# Patient Record
Sex: Female | Born: 1960 | Race: White | Hispanic: No | Marital: Single | Smoking: Current every day smoker
Health system: Southern US, Community
[De-identification: ages and names within clinical notes are randomized; demographics above are authoritative.]

## PROBLEM LIST (undated history)

## (undated) DIAGNOSIS — F329 Major depressive disorder, single episode, unspecified: Secondary | ICD-10-CM

## (undated) DIAGNOSIS — Z8679 Personal history of other diseases of the circulatory system: Secondary | ICD-10-CM

## (undated) DIAGNOSIS — G8929 Other chronic pain: Secondary | ICD-10-CM

## (undated) DIAGNOSIS — F419 Anxiety disorder, unspecified: Secondary | ICD-10-CM

## (undated) DIAGNOSIS — I639 Cerebral infarction, unspecified: Secondary | ICD-10-CM

## (undated) DIAGNOSIS — F32A Depression, unspecified: Secondary | ICD-10-CM

## (undated) DIAGNOSIS — R569 Unspecified convulsions: Secondary | ICD-10-CM

## (undated) DIAGNOSIS — R109 Unspecified abdominal pain: Secondary | ICD-10-CM

## (undated) DIAGNOSIS — I499 Cardiac arrhythmia, unspecified: Secondary | ICD-10-CM

## (undated) HISTORY — PX: OTHER SURGICAL HISTORY: SHX169

---

## 1998-05-23 ENCOUNTER — Ambulatory Visit (HOSPITAL_COMMUNITY): Admission: RE | Admit: 1998-05-23 | Discharge: 1998-05-23 | Payer: Self-pay | Admitting: Internal Medicine

## 1999-11-28 ENCOUNTER — Emergency Department (HOSPITAL_COMMUNITY): Admission: EM | Admit: 1999-11-28 | Discharge: 1999-11-28 | Payer: Self-pay | Admitting: Emergency Medicine

## 1999-11-28 ENCOUNTER — Encounter: Payer: Self-pay | Admitting: Emergency Medicine

## 1999-11-30 ENCOUNTER — Emergency Department (HOSPITAL_COMMUNITY): Admission: EM | Admit: 1999-11-30 | Discharge: 1999-11-30 | Payer: Self-pay | Admitting: Emergency Medicine

## 1999-11-30 ENCOUNTER — Encounter: Payer: Self-pay | Admitting: Emergency Medicine

## 1999-12-03 ENCOUNTER — Ambulatory Visit (HOSPITAL_COMMUNITY): Admission: RE | Admit: 1999-12-03 | Discharge: 1999-12-03 | Payer: Self-pay | Admitting: Gastroenterology

## 2000-02-06 ENCOUNTER — Encounter: Payer: Self-pay | Admitting: Emergency Medicine

## 2000-02-06 ENCOUNTER — Encounter: Payer: Self-pay | Admitting: Gastroenterology

## 2000-02-07 ENCOUNTER — Inpatient Hospital Stay (HOSPITAL_COMMUNITY): Admission: EM | Admit: 2000-02-07 | Discharge: 2000-02-10 | Payer: Self-pay | Admitting: Emergency Medicine

## 2000-02-09 ENCOUNTER — Encounter: Payer: Self-pay | Admitting: Gastroenterology

## 2000-02-18 ENCOUNTER — Ambulatory Visit (HOSPITAL_COMMUNITY): Admission: RE | Admit: 2000-02-18 | Discharge: 2000-02-18 | Payer: Self-pay | Admitting: Gastroenterology

## 2000-03-08 ENCOUNTER — Other Ambulatory Visit: Admission: RE | Admit: 2000-03-08 | Discharge: 2000-03-08 | Payer: Self-pay | Admitting: Obstetrics and Gynecology

## 2000-04-21 ENCOUNTER — Ambulatory Visit (HOSPITAL_COMMUNITY): Admission: RE | Admit: 2000-04-21 | Discharge: 2000-04-21 | Payer: Self-pay | Admitting: Obstetrics and Gynecology

## 2001-04-18 ENCOUNTER — Other Ambulatory Visit: Admission: RE | Admit: 2001-04-18 | Discharge: 2001-04-18 | Payer: Self-pay | Admitting: Obstetrics and Gynecology

## 2015-02-12 ENCOUNTER — Encounter (HOSPITAL_COMMUNITY): Payer: Self-pay | Admitting: Emergency Medicine

## 2015-02-12 ENCOUNTER — Emergency Department (INDEPENDENT_AMBULATORY_CARE_PROVIDER_SITE_OTHER)
Admission: EM | Admit: 2015-02-12 | Discharge: 2015-02-12 | Disposition: A | Discharge status: Home / Self Care | Payer: PRIVATE HEALTH INSURANCE | Source: Home / Self Care | Attending: Family Medicine | Admitting: Family Medicine

## 2015-02-12 HISTORY — DX: Acute intermittent (hepatic) porphyria: E80.21

## 2015-02-12 HISTORY — DX: Depression, unspecified: F32.A

## 2015-02-12 HISTORY — DX: Anxiety disorder, unspecified: F41.9

## 2015-02-12 HISTORY — DX: Other chronic pain: G89.29

## 2015-02-12 HISTORY — DX: Unspecified abdominal pain: R10.9

## 2015-02-12 HISTORY — DX: Major depressive disorder, single episode, unspecified: F32.9

## 2015-02-12 NOTE — Discharge Instructions (Signed)
Porphyria  Porphyria is a group of disorders. It is caused by problems with how your body produces heme. Heme is a substance found in all body tissues. The largest amounts of heme are in the red blood cells, bone marrow, and liver. Your body uses special proteins (called enzymes) to convert chemicals called porphyrins into heme. If any one of the enzymes is abnormal, the process cannot continue. These porphyrins may build up and cause symptoms. The skin and nervous system are most often affected.   Porphyria is usually broken down into 2 categories:  · Acute porphyrias - include forms of the disease with mostly nervous system symptoms (neuron porphyrias). In some cases, there are both nervous system and skin symptoms (neurocutaneous porphyrias). Your body can be damaged in several ways from the acute porphyrias. High blood pressure, kidney failure, and liver cancer can result over time.  · Cutaneous porphyrias - include forms of the disease that cause skin symptoms without affecting your nervous system.  CAUSES   The porphyrias are passed from your parents to you (inherited).   · Some forms of porphyria result from inheriting an abnormal gene from one parent (autosomal dominant).  · Other forms are from inheriting an abnormal gene from each parent (autosomal recessive).  · The risk that individuals in an affected family will have the disease or transmit it to their children is different depending on the type. Porphyria can be triggered by:  ¨ Emotional and physical stress.  ¨ Chemicals.  ¨ Drinking alcohol.  ¨ Menstrual hormones.  ¨ Birth control pills.  ¨ Fasting.  ¨ Infections.  ¨ Exposure to the sun.  ¨ Sedatives.  ¨ Smoking.  ¨ Emotional and physical stress.  ¨ Drugs (barbiturates, tranquilizers).  SYMPTOMS   Specific signs and symptoms depend on the type of porphyria.  People with cutaneous porphyrias can develop:  · Blisters, itching, and swelling of their skin when it is exposed to sunlight.  People with  Acute porphyrias can develop:  · Pain in the chest, abdomen, limbs, or back.  · Other symptoms include muscle numbness, tingling, paralysis, cramping, vomiting, constipation, and personality changes.  · Different psychiatric symptoms can include paranoia, hallucinations, confusion, anxiety, depression, and violent behavior.  These acute porphyrias symptoms may appear intermittently. Other possible symptoms include:  · Unstable vital signs.  · Excessive sweating.  · Painful urination and other bladder problems. In some cases, urine may turn red or dark when exposed to light.  · Fever.  · Restlessness.  · Tremors.  · Vision problems (including blindness).  · Seizures.  In cases of neurocutaneous porphyrias, both skin and nervous system symptoms can occur at the same time. Attacks of porphyria can develop over hours or days. The attacks can last for days or weeks.   DIAGNOSIS   Diagnosis may be difficult because the range of symptoms is common to many disorders. Porphyria is diagnosed through blood, urine, and stool tests. Interpretation of the tests may be complex. If tests are positive for porphyria, specific results can narrow down the exact type that exists.  TREATMENT   Treatment must be specific to the person. Initial treatment can include:  · Stopping medicines that may have started the attack.  · Medicines to help relieve pain, nausea, and vomiting.  · A high carbohydrate diet and good hydration is essential.  Hospitalization is required if there is a severe attack. Medicines to help pain and nausea may be given through an intravenous line. Other important medicines   that may be needed include:  · Hematin (a form of heme).  · Anti-seizure medicines, if needed.  · Medicines to control psychiatric symptoms.  HOME CARE INSTRUCTIONS   · Eat a high carbohydrate diet.  · Eat a low salt and low fat diet if there are problems of high blood pressure.  · Avoid alcoholic beverages.  · Avoid smoking.  · For women, there may  be a benefit in using birth control pills to control your periods.  SEEK MEDICAL CARE IF:   · You develop a recurrence of any of the symptoms noted above.  · You have been advised to start any new prescription medicines. There is a long list of medicines that can possibly trigger an attack or porphyria. Talk to your caregiver to make sure a new medicine is not included in that list.  SEEK IMMEDIATE MEDICAL CARE IF:   You develop a recurrence of any symptoms noted above not helped by the "initial treatment" recommendations described above.  FOR MORE INFORMATION   [employer]: www.porphyriafoundation.com  [employer]: nddic@info.niddk.nih.gov  Document Released: 08/30/2007 Document Revised: 03/19/2014 Document Reviewed: 08/30/2007  [employer]® Patient Information ©2015 [employer]. This information is not intended to replace advice given to you by your health care provider. Make sure you discuss any questions you have with your health care provider.

## 2015-02-12 NOTE — ED Provider Notes (Signed)
CSN: [id number]     Arrival date & time 02/12/15  1344 History   First MD Initiated Contact with Patient 02/12/15 1506     Chief Complaint  Patient presents with  . Abdominal Pain   (Consider location/radiation/quality/duration/timing/severity/associated sxs/prior Treatment) HPI Comments: 54 year old female states she has just moved to the area from [place] and has been here for 4 days. 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 She has been hospitalized, history tree of anemia and other psychological issues. 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 She comes in the urgent care for refills of her narcotic analgesics and benzodiazepines. She states these medicines while taking on a regular basis prevent her from having poor for flareups. She has chronic epigastric pain. At this time she is in no acute distress. She is somewhat anxious and depressed. She holds her epigastrium with her hand but states this is her typical baseline pain. She swelling no signs of infection. No vomiting. She states that if she does not get her narcotics filled today she will end up going into the hospital tomorrow.   Past Medical History  Diagnosis Date  . Acute porphyria   . Chronic abdominal pain   . Anxiety   . Depression    Past Surgical History  Procedure Laterality Date  . Abdominal patch     No family history on file. History  Substance Use Topics  . Smoking status: Current Every Day Smoker  . Smokeless tobacco: Not on file  . Alcohol Use: No   OB History    No data available     Review of Systems  Constitutional: Positive for activity change and appetite change. Negative for fever.  HENT: Negative.   Respiratory: Negative.   Cardiovascular: Negative for chest pain.  Gastrointestinal: Positive for abdominal pain. Negative for vomiting and diarrhea.  Genitourinary: Negative.    Neurological: Negative.   Psychiatric/Behavioral: Positive for sleep disturbance and dysphoric mood. Negative for suicidal ideas. The patient is nervous/anxious.     Allergies  Heparin; Penicillins; and Toradol  Home Medications   Prior to Admission medications   Medication Sig Start Date End Date Taking? Authorizing Provider  ALPRAZolam Prudy Feeler) 0.5 MG tablet Take 0.5 mg by mouth 2 (two) times daily as needed for anxiety.   Yes Historical Provider, MD  clonazePAM (KLONOPIN) 2 MG tablet Take 2 mg by mouth 2 (two) times daily.   Yes Historical Provider, MD  fentaNYL (DURAGESIC - DOSED MCG/HR) 75 MCG/HR Place 75 mcg onto the skin every 3 (three) days.   Yes Historical Provider, MD  OxyCODONE (OXYCONTIN) 15 mg T12A 12 hr tablet Take 15 mg by mouth every 12 (twelve) hours.   Yes Historical Provider, MD   BP 134/94 mmHg  Pulse 89  Temp(Src) 98.6 F (37 C) (Oral)  Resp 16  SpO2 96% Physical Exam  Constitutional: She is oriented to person, place, and time. She appears well-developed and well-nourished. No distress.  Eyes: Conjunctivae and EOM are normal.  Neck: Normal range of motion. Neck supple.  Cardiovascular: Normal rate, regular rhythm, normal heart sounds and intact distal pulses.   Pulmonary/Chest: Effort normal. No respiratory distress. She has no rales.  Mildly diminished breath sounds bilaterally. Rare expiratory wheeze.  Abdominal: Soft. Bowel sounds are normal. She exhibits no distension. There is no rebound and no guarding.  Mild tenderness over the epigastrium. Abdomen is  scaphoid   Neurological: She is alert and oriented to person, place, and time.  Skin: Skin is warm and dry.  Psychiatric: She has a normal mood and affect.  Nursing note and vitals reviewed.   ED Course  Procedures (including critical care time) Labs Review Labs Reviewed - No data to display  Imaging Review No results found.   MDM   1. Porphyria    Pt requests refill of Fentanyl and  Oxycodone for prevention of attacks. Unable to refill here Gave options for follow up and pt st has a PCP here that has seen her before. Pt left when told we could not fill her narcotics.    Hayden Rasmussenavid Lealon Vanputten, NP 02/12/15 [phone]

## 2015-02-12 NOTE — ED Notes (Signed)
Chronic abdominal pain.  Patient gives a long history of abdominal pain, diagnoses, hospital admissions.  Patient is new to the area from [place], no pcp, running out of chronic pain medications.

## 2015-02-16 ENCOUNTER — Encounter (HOSPITAL_COMMUNITY): Payer: Self-pay | Admitting: *Deleted

## 2015-02-16 ENCOUNTER — Emergency Department (HOSPITAL_COMMUNITY)
Admission: EM | Admit: 2015-02-16 | Discharge: 2015-02-16 | Disposition: A | Discharge status: Home / Self Care | Payer: Medicaid - Out of State | Attending: Emergency Medicine | Admitting: Emergency Medicine

## 2015-02-16 DIAGNOSIS — R1013 Epigastric pain: Secondary | ICD-10-CM | POA: Insufficient documentation

## 2015-02-16 DIAGNOSIS — F329 Major depressive disorder, single episode, unspecified: Secondary | ICD-10-CM | POA: Insufficient documentation

## 2015-02-16 DIAGNOSIS — Z8673 Personal history of transient ischemic attack (TIA), and cerebral infarction without residual deficits: Secondary | ICD-10-CM | POA: Diagnosis not present

## 2015-02-16 DIAGNOSIS — Z8669 Personal history of other diseases of the nervous system and sense organs: Secondary | ICD-10-CM | POA: Insufficient documentation

## 2015-02-16 DIAGNOSIS — Z880 Allergy status to penicillin: Secondary | ICD-10-CM | POA: Insufficient documentation

## 2015-02-16 DIAGNOSIS — R1110 Vomiting, unspecified: Secondary | ICD-10-CM | POA: Insufficient documentation

## 2015-02-16 DIAGNOSIS — Z79899 Other long term (current) drug therapy: Secondary | ICD-10-CM | POA: Diagnosis not present

## 2015-02-16 DIAGNOSIS — G8929 Other chronic pain: Secondary | ICD-10-CM | POA: Insufficient documentation

## 2015-02-16 DIAGNOSIS — L409 Psoriasis, unspecified: Secondary | ICD-10-CM | POA: Insufficient documentation

## 2015-02-16 DIAGNOSIS — F419 Anxiety disorder, unspecified: Secondary | ICD-10-CM | POA: Diagnosis not present

## 2015-02-16 DIAGNOSIS — Z720 Tobacco use: Secondary | ICD-10-CM | POA: Insufficient documentation

## 2015-02-16 HISTORY — DX: Unspecified convulsions: R56.9

## 2015-02-16 HISTORY — DX: Cerebral infarction, unspecified: I63.9

## 2015-02-16 LAB — COMPREHENSIVE METABOLIC PANEL
ALK PHOS: 55 U/L (ref 39–117)
ALT: 8 U/L (ref 0–35)
AST: 17 U/L (ref 0–37)
Albumin: 4 g/dL (ref 3.5–5.2)
Anion gap: 11 (ref 5–15)
BUN: 9 mg/dL (ref 6–23)
CALCIUM: 9.5 mg/dL (ref 8.4–10.5)
CO2: 21 mmol/L (ref 19–32)
Chloride: 108 mmol/L (ref 96–112)
Creatinine, Ser: 0.83 mg/dL (ref 0.50–1.10)
GFR calc Af Amer: >90 mL/min (ref >90)
GFR, EST NON AFRICAN AMERICAN: 80 mL/min — AB (ref >90)
Glucose, Bld: 99 mg/dL (ref 70–99)
Potassium: 4 mmol/L (ref 3.5–5.1)
SODIUM: 140 mmol/L (ref 135–145)
Total Bilirubin: 0.7 mg/dL (ref 0.3–1.2)
Total Protein: 7.2 g/dL (ref 6.0–8.3)

## 2015-02-16 LAB — URINALYSIS, ROUTINE W REFLEX MICROSCOPIC
Glucose, UA: NEGATIVE mg/dL
HGB URINE DIPSTICK: NEGATIVE
Ketones, ur: 15 mg/dL — AB
Leukocytes, UA: NEGATIVE
Nitrite: NEGATIVE
PH: 5 (ref 5.0–8.0)
Protein, ur: NEGATIVE mg/dL
Specific Gravity, Urine: 1.027 (ref 1.005–1.030)
Urobilinogen, UA: 1 mg/dL (ref 0.0–1.0)

## 2015-02-16 LAB — URINE MICROSCOPIC-ADD ON

## 2015-02-16 LAB — CBC WITH DIFFERENTIAL/PLATELET
Basophils Absolute: 0.1 10*3/uL (ref 0.0–0.1)
Basophils Relative: 1 % (ref 0–1)
Eosinophils Absolute: 0.1 10*3/uL (ref 0.0–0.7)
Eosinophils Relative: 2 % (ref 0–5)
HCT: 44.7 % (ref 36.0–46.0)
HEMOGLOBIN: 15.1 g/dL — AB (ref 12.0–15.0)
LYMPHS ABS: 1.9 10*3/uL (ref 0.7–4.0)
LYMPHS PCT: 26 % (ref 12–46)
MCH: 30.2 pg (ref 26.0–34.0)
MCHC: 33.8 g/dL (ref 30.0–36.0)
MCV: 89.4 fL (ref 78.0–100.0)
Monocytes Absolute: 0.3 10*3/uL (ref 0.1–1.0)
Monocytes Relative: 4 % (ref 3–12)
NEUTROS ABS: 4.9 10*3/uL (ref 1.7–7.7)
NEUTROS PCT: 67 % (ref 43–77)
Platelets: 225 10*3/uL (ref 150–400)
RBC: 5 MIL/uL (ref 3.87–5.11)
RDW: 13.3 % (ref 11.5–15.5)
WBC: 7.3 10*3/uL (ref 4.0–10.5)

## 2015-02-16 LAB — LIPASE, BLOOD: Lipase: 41 U/L (ref 11–59)

## 2015-02-16 MED ORDER — DICYCLOMINE HCL 10 MG/ML IM SOLN
20.0000 mg | Freq: Once | INTRAMUSCULAR | Status: AC
  Administered 2015-02-16: 20 mg via INTRAMUSCULAR
  Filled 2015-02-16: qty 2

## 2015-02-16 MED ORDER — ONDANSETRON HCL 4 MG/2ML IJ SOLN
4.0000 mg | Freq: Once | INTRAMUSCULAR | Status: AC
  Administered 2015-02-16: 4 mg via INTRAVENOUS
  Filled 2015-02-16: qty 2

## 2015-02-16 MED ORDER — ANTICOAGULANT SODIUM CITRATE 4% (200MG/5ML) IV SOLN
5.0000 mL | Freq: Once | Status: DC
  Filled 2015-02-16: qty 250

## 2015-02-16 NOTE — ED Notes (Addendum)
EDPA and this RN at bedside to speak with patient regarding her pain and plan of care.  Paperwork returned to patient's bedside.  Patient continues to be verbally aggressive towards staff and yelling that she is in pain. Pt also stating "I will be suicidal if you don't give me pain medicine."  EDPA at bedside for this statement and is aware.  Attempted to dim lights, therapeutically communicate with patient, and explain process.

## 2015-02-16 NOTE — ED Notes (Signed)
Pt unwilling to take discharge papers or e-sign for discharge.  PT continues to use expletives towards this RN and other staff members. Offered to anticoagulate Port again and explained risks if not performed.  Pt continues to refuse.  PT is alert and oriented, ambulated with steady gait from wheelchair in waiting area.

## 2015-02-16 NOTE — ED Provider Notes (Signed)
CSN: [id number]     Arrival date & time 02/16/15  0825 History   First MD Initiated Contact with Patient 02/16/15 [id number]     Chief Complaint  Patient presents with  . Abdominal Pain     (Consider location/radiation/quality/duration/timing/severity/associated sxs/prior Treatment) Patient is a 54 y.o. female presenting with abdominal pain. The history is provided by the patient. No language interpreter was used.  Abdominal Pain Associated symptoms: vomiting   Associated symptoms: no diarrhea and no vaginal bleeding   Ms. Jewel BaizeDarcy is a 54 y.o. White female who has a history of porphyria, chronic abdominal pain, and anxiety.  She presents for gradual onset constant epigastric pain that began at 5am this morning and she states it is similar to her previous "flare ups with porphryia."  She is nauseated and states she has some dysuria and hematuria for the past 3 days. She states her husband recently passed away and she takes xanax and klonopin for anxiety. She denies any fever, chills, chest pain, shortness of breath, vaginal discharge or pelvic pain.   Past Medical History  Diagnosis Date  . Acute porphyria   . Chronic abdominal pain   . Anxiety   . Depression   . Seizures   . Stroke    Past Surgical History  Procedure Laterality Date  . Abdominal patch     History reviewed. No pertinent family history. History  Substance Use Topics  . Smoking status: Current Every Day Smoker  . Smokeless tobacco: Not on file  . Alcohol Use: No   OB History    No data available     Review of Systems  Gastrointestinal: Positive for vomiting and abdominal pain. Negative for diarrhea and blood in stool.  Genitourinary: Negative for vaginal bleeding.  Neurological: Negative for dizziness.  All other systems reviewed and are negative.     Allergies  Heparin; Penicillins; and Toradol  Home Medications   Prior to Admission medications   Medication Sig Start Date End Date Taking? Authorizing  Provider  ALPRAZolam Prudy Feeler(XANAX) 0.5 MG tablet Take 0.5 mg by mouth 2 (two) times daily as needed for anxiety.    Historical Provider, MD  clonazePAM (KLONOPIN) 2 MG tablet Take 2 mg by mouth 2 (two) times daily.    Historical Provider, MD  fentaNYL (DURAGESIC - DOSED MCG/HR) 75 MCG/HR Place 75 mcg onto the skin every 3 (three) days.    Historical Provider, MD  OxyCODONE (OXYCONTIN) 15 mg T12A 12 hr tablet Take 15 mg by mouth every 12 (twelve) hours.    Historical Provider, MD   BP 111/95 mmHg  Pulse 70  Temp(Src) 97.5 F (36.4 C) (Oral)  Resp 16  SpO2 99% Physical Exam  Constitutional: She is oriented to person, place, and time. She appears well-developed and well-nourished.  HENT:  Head: Normocephalic and atraumatic.  Eyes: Conjunctivae and EOM are normal.  Neck: Normal range of motion. Neck supple.  Cardiovascular: Normal rate, regular rhythm and normal heart sounds.   Pulmonary/Chest: Effort normal and breath sounds normal.  Right sided chest port.   Abdominal: Soft. Normal appearance. She exhibits no distension and no mass. There is tenderness in the epigastric area. There is no guarding. No hernia.    Musculoskeletal: Normal range of motion.  Neurological: She is alert and oriented to person, place, and time.  Skin: Skin is warm and dry.  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   Nursing note and vitals reviewed.   ED Course  Procedures (including critical care time) Labs Review Labs  Reviewed  CBC WITH DIFFERENTIAL/PLATELET - Abnormal; Notable for the following:    Hemoglobin 15.1 (*)    All other components within normal limits  COMPREHENSIVE METABOLIC PANEL - Abnormal; Notable for the following:    GFR calc non Af Amer 80 (*)    All other components within normal limits  URINALYSIS, ROUTINE W REFLEX MICROSCOPIC - Abnormal; Notable for the following:    Color, Urine AMBER (*)    APPearance TURBID (*)    Bilirubin Urine SMALL (*)    Ketones, ur 15 (*)    All other components within normal  limits  LIPASE, BLOOD  URINE MICROSCOPIC-ADD ON    Imaging Review No results found.   EKG Interpretation None      MDM   Final diagnoses:  Epigastric pain   Patient has a history of porphyria and chronic abdominal pain during "flare ups."  She states her abdominal pain is similar to pain she has had in the past and that pain medication usually makes everything better including the nausea.  She refused zofran initially.  She has several documents at bedside with hand written medical problems listed. She has a port on the right side of her chest for  Phenergan due to porphyria.   She was seen at urgent care for the same 4 days ago.  She states that if she does not get morphine or dilaudid she will go out in the hall and scream.  She is crying and screaming in the room. She has not vomited while in the ED but states she will if she does not get any pain meds.  She also stated that she just moved here and doesn't have a pcp but now says she has an appointment on Monday with her pcp and that she just needs meds to get her through until Monday.  She also says, "I will say I am suicidal if you don't give me pain medication." I discussed this patient with Dr. Loretha Stapler who has seen the patient and agrees that she does not need narcotic pain medication.  Her vitals are normal.  No UTI. Labs are unremarkable. I thoroughly discussed the need for follow up with pain management for chronic abdominal pain.     Catha Gosselin, PA-C 02/16/15 1555  Blake Divine, MD 02/17/15 0730

## 2015-02-16 NOTE — ED Notes (Signed)
Pt has port, not collecting labs at triage.

## 2015-02-16 NOTE — ED Notes (Signed)
Patient screaming out in pain requesting narcotic pain medication specifically. EDPA Dahlia ClientHannah and Dr. Loretha StaplerWofford aware.  Patient states she has "medical records" with her - these have been given to EDPA to review.

## 2015-02-16 NOTE — Discharge Instructions (Signed)

## 2015-02-16 NOTE — ED Notes (Addendum)
Patient self-extracated Port-O-Cath access.  Explained risks/dangers of doing this to patient.  Pt using expletives and is not willing to have port reaccessed to flush with anticoagulant. Spoke with IV team to let them know of this as well.

## 2015-02-16 NOTE — ED Notes (Signed)
Dr. Loretha StaplerWofford at bedside to speak with patient.

## 2015-02-16 NOTE — ED Notes (Signed)
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

## 2015-02-17 ENCOUNTER — Emergency Department (HOSPITAL_COMMUNITY): Payer: Medicaid - Out of State

## 2015-02-17 ENCOUNTER — Inpatient Hospital Stay (HOSPITAL_COMMUNITY)
Admission: EM | Admit: 2015-02-17 | Discharge: 2015-02-19 | DRG: 871 | Disposition: A | Discharge status: Home / Self Care | Payer: Medicaid - Out of State | Attending: Internal Medicine | Admitting: Internal Medicine

## 2015-02-17 ENCOUNTER — Encounter (HOSPITAL_COMMUNITY): Payer: Self-pay | Admitting: Emergency Medicine

## 2015-02-17 DIAGNOSIS — F329 Major depressive disorder, single episode, unspecified: Secondary | ICD-10-CM | POA: Diagnosis present

## 2015-02-17 DIAGNOSIS — Z8674 Personal history of sudden cardiac arrest: Secondary | ICD-10-CM

## 2015-02-17 DIAGNOSIS — R1084 Generalized abdominal pain: Secondary | ICD-10-CM | POA: Insufficient documentation

## 2015-02-17 DIAGNOSIS — R109 Unspecified abdominal pain: Secondary | ICD-10-CM

## 2015-02-17 DIAGNOSIS — Z79891 Long term (current) use of opiate analgesic: Secondary | ICD-10-CM

## 2015-02-17 DIAGNOSIS — F419 Anxiety disorder, unspecified: Secondary | ICD-10-CM | POA: Diagnosis present

## 2015-02-17 DIAGNOSIS — D72829 Elevated white blood cell count, unspecified: Secondary | ICD-10-CM | POA: Diagnosis present

## 2015-02-17 DIAGNOSIS — F32A Depression, unspecified: Secondary | ICD-10-CM | POA: Diagnosis present

## 2015-02-17 DIAGNOSIS — R569 Unspecified convulsions: Secondary | ICD-10-CM | POA: Diagnosis present

## 2015-02-17 DIAGNOSIS — F418 Other specified anxiety disorders: Secondary | ICD-10-CM | POA: Diagnosis present

## 2015-02-17 DIAGNOSIS — Z8673 Personal history of transient ischemic attack (TIA), and cerebral infarction without residual deficits: Secondary | ICD-10-CM

## 2015-02-17 DIAGNOSIS — IMO0001 Reserved for inherently not codable concepts without codable children: Secondary | ICD-10-CM | POA: Insufficient documentation

## 2015-02-17 DIAGNOSIS — K921 Melena: Secondary | ICD-10-CM | POA: Diagnosis present

## 2015-02-17 DIAGNOSIS — I639 Cerebral infarction, unspecified: Secondary | ICD-10-CM | POA: Diagnosis present

## 2015-02-17 DIAGNOSIS — R112 Nausea with vomiting, unspecified: Secondary | ICD-10-CM | POA: Diagnosis present

## 2015-02-17 DIAGNOSIS — F17210 Nicotine dependence, cigarettes, uncomplicated: Secondary | ICD-10-CM | POA: Diagnosis present

## 2015-02-17 DIAGNOSIS — G8929 Other chronic pain: Secondary | ICD-10-CM | POA: Diagnosis present

## 2015-02-17 DIAGNOSIS — E43 Unspecified severe protein-calorie malnutrition: Secondary | ICD-10-CM | POA: Diagnosis present

## 2015-02-17 DIAGNOSIS — Z79899 Other long term (current) drug therapy: Secondary | ICD-10-CM

## 2015-02-17 DIAGNOSIS — Z888 Allergy status to other drugs, medicaments and biological substances status: Secondary | ICD-10-CM

## 2015-02-17 DIAGNOSIS — I4891 Unspecified atrial fibrillation: Secondary | ICD-10-CM | POA: Diagnosis present

## 2015-02-17 DIAGNOSIS — Z880 Allergy status to penicillin: Secondary | ICD-10-CM

## 2015-02-17 DIAGNOSIS — Z681 Body mass index (BMI) 19 or less, adult: Secondary | ICD-10-CM

## 2015-02-17 DIAGNOSIS — A419 Sepsis, unspecified organism: Secondary | ICD-10-CM

## 2015-02-17 DIAGNOSIS — I4581 Long QT syndrome: Secondary | ICD-10-CM | POA: Diagnosis present

## 2015-02-17 DIAGNOSIS — L409 Psoriasis, unspecified: Secondary | ICD-10-CM | POA: Diagnosis present

## 2015-02-17 DIAGNOSIS — I959 Hypotension, unspecified: Secondary | ICD-10-CM | POA: Diagnosis present

## 2015-02-17 DIAGNOSIS — I4892 Unspecified atrial flutter: Secondary | ICD-10-CM | POA: Diagnosis present

## 2015-02-17 DIAGNOSIS — R9431 Abnormal electrocardiogram [ECG] [EKG]: Secondary | ICD-10-CM

## 2015-02-17 DIAGNOSIS — R1013 Epigastric pain: Secondary | ICD-10-CM | POA: Diagnosis present

## 2015-02-17 HISTORY — DX: Cardiac arrhythmia, unspecified: I49.9

## 2015-02-17 LAB — I-STAT BETA HCG BLOOD, ED (MC, WL, AP ONLY): I-stat hCG, quantitative: <5 m[IU]/mL (ref <5)

## 2015-02-17 LAB — URINE MICROSCOPIC-ADD ON

## 2015-02-17 LAB — CBC WITH DIFFERENTIAL/PLATELET
BASOS PCT: 0 % (ref 0–1)
Basophils Absolute: 0.1 10*3/uL (ref 0.0–0.1)
EOS ABS: 0 10*3/uL (ref 0.0–0.7)
EOS PCT: 0 % (ref 0–5)
HEMATOCRIT: 48.1 % — AB (ref 36.0–46.0)
Hemoglobin: 16.2 g/dL — ABNORMAL HIGH (ref 12.0–15.0)
Lymphocytes Relative: 11 % — ABNORMAL LOW (ref 12–46)
Lymphs Abs: 1.7 10*3/uL (ref 0.7–4.0)
MCH: 30.6 pg (ref 26.0–34.0)
MCHC: 33.7 g/dL (ref 30.0–36.0)
MCV: 90.9 fL (ref 78.0–100.0)
MONO ABS: 0.5 10*3/uL (ref 0.1–1.0)
Monocytes Relative: 3 % (ref 3–12)
NEUTROS ABS: 12.9 10*3/uL — AB (ref 1.7–7.7)
NEUTROS PCT: 86 % — AB (ref 43–77)
PLATELETS: 281 10*3/uL (ref 150–400)
RBC: 5.29 MIL/uL — ABNORMAL HIGH (ref 3.87–5.11)
RDW: 13.5 % (ref 11.5–15.5)
WBC: 15.1 10*3/uL — AB (ref 4.0–10.5)

## 2015-02-17 LAB — URINALYSIS, ROUTINE W REFLEX MICROSCOPIC
Glucose, UA: NEGATIVE mg/dL
KETONES UR: 15 mg/dL — AB
LEUKOCYTES UA: NEGATIVE
Nitrite: NEGATIVE
Protein, ur: 30 mg/dL — AB
Specific Gravity, Urine: 1.03 (ref 1.005–1.030)
Urobilinogen, UA: 1 mg/dL (ref 0.0–1.0)
pH: 5.5 (ref 5.0–8.0)

## 2015-02-17 LAB — COMPREHENSIVE METABOLIC PANEL
ALK PHOS: 62 U/L (ref 39–117)
ALT: 13 U/L (ref 0–35)
ANION GAP: 13 (ref 5–15)
AST: 20 U/L (ref 0–37)
Albumin: 4.5 g/dL (ref 3.5–5.2)
BUN: 12 mg/dL (ref 6–23)
CO2: 20 mmol/L (ref 19–32)
Calcium: 9.7 mg/dL (ref 8.4–10.5)
Chloride: 110 mmol/L (ref 96–112)
Creatinine, Ser: 0.97 mg/dL (ref 0.50–1.10)
GFR calc non Af Amer: 66 mL/min — ABNORMAL LOW (ref >90)
GFR, EST AFRICAN AMERICAN: 77 mL/min — AB (ref >90)
GLUCOSE: 161 mg/dL — AB (ref 70–99)
POTASSIUM: 3.4 mmol/L — AB (ref 3.5–5.1)
SODIUM: 143 mmol/L (ref 135–145)
TOTAL PROTEIN: 8.1 g/dL (ref 6.0–8.3)
Total Bilirubin: 0.9 mg/dL (ref 0.3–1.2)

## 2015-02-17 LAB — LIPASE, BLOOD: Lipase: 43 U/L (ref 11–59)

## 2015-02-17 LAB — POC URINE PREG, ED: PREG TEST UR: NEGATIVE

## 2015-02-17 LAB — MAGNESIUM: MAGNESIUM: 1.6 mg/dL (ref 1.5–2.5)

## 2015-02-17 LAB — TROPONIN I

## 2015-02-17 LAB — I-STAT CG4 LACTIC ACID, ED: Lactic Acid, Venous: 1.68 mmol/L (ref 0.5–2.0)

## 2015-02-17 LAB — POC OCCULT BLOOD, ED: FECAL OCCULT BLD: POSITIVE — AB

## 2015-02-17 MED ORDER — ONDANSETRON HCL 4 MG/2ML IJ SOLN
4.0000 mg | Freq: Once | INTRAMUSCULAR | Status: AC
  Administered 2015-02-17: 4 mg via INTRAVENOUS
  Filled 2015-02-17: qty 2

## 2015-02-17 MED ORDER — SODIUM CHLORIDE 0.9 % IV BOLUS (SEPSIS)
1000.0000 mL | Freq: Once | INTRAVENOUS | Status: AC
  Administered 2015-02-17: 1000 mL via INTRAVENOUS

## 2015-02-17 MED ORDER — HYDROMORPHONE HCL 1 MG/ML IJ SOLN
1.0000 mg | Freq: Once | INTRAMUSCULAR | Status: AC
Start: 2015-02-17 — End: 2015-02-17
  Administered 2015-02-17: 1 mg via INTRAVENOUS
  Filled 2015-02-17: qty 1

## 2015-02-17 MED ORDER — HYDROMORPHONE HCL 1 MG/ML IJ SOLN
1.0000 mg | Freq: Once | INTRAMUSCULAR | Status: AC
  Administered 2015-02-17: 1 mg via INTRAVENOUS
  Filled 2015-02-17: qty 1

## 2015-02-17 MED ORDER — IOHEXOL 300 MG/ML  SOLN: 100.0000 mL | Freq: Once | INTRAMUSCULAR | Status: AC | PRN

## 2015-02-17 NOTE — ED Notes (Signed)
Right after assessing while entering data, patient vomited a small amount. Informed provider about vomiting and pt requesting additional pain medication.

## 2015-02-17 NOTE — ED Notes (Signed)
Nurse will get blood from IV

## 2015-02-17 NOTE — ED Notes (Signed)
Pt from home via [facility] c/o generalized abdominal pain and vomiting. [facility]. PT reports taking 0.5 of xanax today. Pt appears disheveled. Pt yelling help me.

## 2015-02-17 NOTE — ED Notes (Signed)
Pt ambulated to restroom and back to bed with assistance. Tolerated it fair.

## 2015-02-17 NOTE — ED Notes (Signed)
Patient is vomiting and yelling in pain. Comfort measures given, waiting for MD

## 2015-02-17 NOTE — ED Provider Notes (Signed)
CSN: [id number]     Arrival date & time 02/17/15  1752 History   First MD Initiated Contact with Patient 02/17/15 1825     Chief Complaint  Patient presents with  . Abdominal Pain     (Consider location/radiation/quality/duration/timing/severity/associated sxs/prior Treatment) The history is provided by the patient. No language interpreter was used.  Lauren Atkinson is a 54 y/o F with PMHx of acute porphyria, chronic abdominal pain, anxiety, depression, seizure, stroke presenting to the ED with abdominal pain that started approximately 3 days ago and is progressing gotten worse. Patient reports that the pain is localized epigastric described as a sharp, stabbing pain without radiation. Reported that she started to experience nausea and vomiting today-when asked her many times patient has vomited patient reports "seems like 100," stated that she has been dry heaving for the past couple of times. Stated that she has been unable to keep any food or fluids down. Patient reported that she has been having bloody stools starting today. Reported that she has been having generalize weakness. Stated that she was seen and assessed in ED setting yesterday, reported that she continues to have pain. Stated that she has an appointment with Dr. August Saucer, tomorrow. Stated that she has a port placed for approximately 3 years. Stated that in the past she has been given Dilaudid or high dose of morphine for her pain. Patient currently does not have a primary care provider. Denied fever, chills, chest pain, shortness of breath, difficulty breathing, blurred vision, sudden loss of vision, neck pain, neck stiffness, auditory visual hallucination. PCP Dr. August Saucer  Past Medical History  Diagnosis Date  . Acute porphyria   . Chronic abdominal pain   . Anxiety   . Depression   . Seizures   . Stroke    Past Surgical History  Procedure Laterality Date  . Abdominal patch     No family history on file. History  Substance Use  Topics  . Smoking status: Current Every Day Smoker  . Smokeless tobacco: Not on file  . Alcohol Use: No   OB History    No data available     Review of Systems  Constitutional: Negative for fever and chills.  Respiratory: Negative for chest tightness and shortness of breath.   Cardiovascular: Negative for chest pain.  Gastrointestinal: Positive for nausea, vomiting, abdominal pain, diarrhea and blood in stool.  Musculoskeletal: Negative for back pain, neck pain and neck stiffness.      Allergies  Heparin; Penicillins; and Toradol  Home Medications   Prior to Admission medications   Medication Sig Start Date End Date Taking? Authorizing Provider  ALPRAZolam Prudy Feeler) 0.5 MG tablet Take 0.5 mg by mouth 2 (two) times daily as needed for anxiety.   Yes Historical Provider, MD  clonazePAM (KLONOPIN) 2 MG tablet Take 2 mg by mouth 2 (two) times daily.   Yes Historical Provider, MD  fentaNYL (DURAGESIC - DOSED MCG/HR) 75 MCG/HR Place 75 mcg onto the skin every 3 (three) days.   Yes Historical Provider, MD  OxyCODONE (OXYCONTIN) 15 mg T12A 12 hr tablet Take 15 mg by mouth every 12 (twelve) hours.   Yes Historical Provider, MD   BP 133/91 mmHg  Pulse 57  Temp(Src) 98.2 F (36.8 C) (Oral)  Resp 20  SpO2 98%  LMP  Physical Exam  Constitutional: She is oriented to person, place, and time. No distress.  Patient yelling and screaming while laying in bed tossing and turning secondary to pain.  HENT:  Head:  Normocephalic and atraumatic.  Eyes: Conjunctivae and EOM are normal. Pupils are equal, round, and reactive to light. Right eye exhibits no discharge. Left eye exhibits no discharge.  Neck: Normal range of motion. Neck supple. No tracheal deviation present.  Cardiovascular: Normal rate, regular rhythm and normal heart sounds.  Exam reveals no friction rub.   No murmur heard. Pulmonary/Chest: Effort normal and breath sounds normal. No respiratory distress. She has no wheezes. She has  no rales.  Abdominal: Soft. Bowel sounds are normal. She exhibits no distension. There is tenderness. There is no rebound and no guarding.  Diffuse tenderness upon palpation to the abdomen  Musculoskeletal: Normal range of motion.  Lymphadenopathy:    She has no cervical adenopathy.  Neurological: She is alert and oriented to person, place, and time. No cranial nerve deficit. She exhibits normal muscle tone. Coordination normal.  Skin: Skin is warm and dry. No rash noted. She is not diaphoretic. No erythema.  Psychiatric: She has a normal mood and affect. Her behavior is normal. Thought content normal.  Nursing note and vitals reviewed.   ED Course  Procedures (including critical care time)  Results for orders placed or performed during the hospital encounter of 02/17/15  CBC with Differential/Platelet  Result Value Ref Range   WBC 15.1 (H) 4.0 - 10.5 K/uL   RBC 5.29 (H) 3.87 - 5.11 MIL/uL   Hemoglobin 16.2 (H) 12.0 - 15.0 g/dL   HCT 16.1 (H) 09.6 - 04.5 %   MCV 90.9 78.0 - 100.0 fL   MCH 30.6 26.0 - 34.0 pg   MCHC 33.7 30.0 - 36.0 g/dL   RDW 40.9 81.1 - 91.4 %   Platelets 281 150 - 400 K/uL   Neutrophils Relative % 86 (H) 43 - 77 %   Neutro Abs 12.9 (H) 1.7 - 7.7 K/uL   Lymphocytes Relative 11 (L) 12 - 46 %   Lymphs Abs 1.7 0.7 - 4.0 K/uL   Monocytes Relative 3 3 - 12 %   Monocytes Absolute 0.5 0.1 - 1.0 K/uL   Eosinophils Relative 0 0 - 5 %   Eosinophils Absolute 0.0 0.0 - 0.7 K/uL   Basophils Relative 0 0 - 1 %   Basophils Absolute 0.1 0.0 - 0.1 K/uL  Comprehensive metabolic panel  Result Value Ref Range   Sodium 143 135 - 145 mmol/L   Potassium 3.4 (L) 3.5 - 5.1 mmol/L   Chloride 110 96 - 112 mmol/L   CO2 20 19 - 32 mmol/L   Glucose, Bld 161 (H) 70 - 99 mg/dL   BUN 12 6 - 23 mg/dL   Creatinine, Ser 7.82 0.50 - 1.10 mg/dL   Calcium 9.7 8.4 - 95.6 mg/dL   Total Protein 8.1 6.0 - 8.3 g/dL   Albumin 4.5 3.5 - 5.2 g/dL   AST 20 0 - 37 U/L   ALT 13 0 - 35 U/L    Alkaline Phosphatase 62 39 - 117 U/L   Total Bilirubin 0.9 0.3 - 1.2 mg/dL   GFR calc non Af Amer 66 (L) >90 mL/min   GFR calc Af Amer 77 (L) >90 mL/min   Anion gap 13 5 - 15  Lipase, blood  Result Value Ref Range   Lipase 43 11 - 59 U/L  Urinalysis, Routine w reflex microscopic  Result Value Ref Range   Color, Urine AMBER (A) YELLOW   APPearance CLOUDY (A) CLEAR   Specific Gravity, Urine 1.030 1.005 - 1.030   pH 5.5 5.0 -  8.0   Glucose, UA NEGATIVE NEGATIVE mg/dL   Hgb urine dipstick MODERATE (A) NEGATIVE   Bilirubin Urine MODERATE (A) NEGATIVE   Ketones, ur 15 (A) NEGATIVE mg/dL   Protein, ur 30 (A) NEGATIVE mg/dL   Urobilinogen, UA 1.0 0.0 - 1.0 mg/dL   Nitrite NEGATIVE NEGATIVE   Leukocytes, UA NEGATIVE NEGATIVE  Urine microscopic-add on  Result Value Ref Range   Squamous Epithelial / LPF FEW (A) RARE   WBC, UA 0-2 <3 WBC/hpf   RBC / HPF 0-2 <3 RBC/hpf   Bacteria, UA RARE RARE   Urine-Other MUCOUS PRESENT   Troponin I  Result Value Ref Range   Troponin I <0.03 <0.031 ng/mL  Magnesium  Result Value Ref Range   Magnesium 1.6 1.5 - 2.5 mg/dL  POC urine preg, ED (not at [facility])  Result Value Ref Range   Preg Test, Ur NEGATIVE NEGATIVE  I-Stat CG4 Lactic Acid, ED  Result Value Ref Range   Lactic Acid, Venous 1.68 0.5 - 2.0 mmol/L  POC occult blood, ED  Result Value Ref Range   Fecal Occult Bld POSITIVE (A) NEGATIVE  I-Stat Beta hCG blood, ED ([facility], [facility], [facility] only)  Result Value Ref Range   I-stat hCG, quantitative <5.0 <5 mIU/mL   Comment 3            Labs Review Labs Reviewed  CBC WITH DIFFERENTIAL/PLATELET - Abnormal; Notable for the following:    WBC 15.1 (*)    RBC 5.29 (*)    Hemoglobin 16.2 (*)    HCT 48.1 (*)    Neutrophils Relative % 86 (*)    Neutro Abs 12.9 (*)    Lymphocytes Relative 11 (*)    All other components within normal limits  COMPREHENSIVE METABOLIC PANEL - Abnormal; Notable for the following:    Potassium 3.4 (*)    Glucose, Bld 161 (*)     GFR calc non Af Amer 66 (*)    GFR calc Af Amer 77 (*)    All other components within normal limits  URINALYSIS, ROUTINE W REFLEX MICROSCOPIC - Abnormal; Notable for the following:    Color, Urine AMBER (*)    APPearance CLOUDY (*)    Hgb urine dipstick MODERATE (*)    Bilirubin Urine MODERATE (*)    Ketones, ur 15 (*)    Protein, ur 30 (*)    All other components within normal limits  URINE MICROSCOPIC-ADD ON - Abnormal; Notable for the following:    Squamous Epithelial / LPF FEW (*)    All other components within normal limits  POC OCCULT BLOOD, ED - Abnormal; Notable for the following:    Fecal Occult Bld POSITIVE (*)    All other components within normal limits  LIPASE, BLOOD  TROPONIN I  MAGNESIUM  OCCULT BLOOD X 1 CARD TO LAB, STOOL  POC URINE PREG, ED  I-STAT CG4 LACTIC ACID, ED  I-STAT BETA HCG BLOOD, ED ([facility], [facility], [facility] ONLY)    Imaging Review Ct Abdomen Pelvis W Contrast  02/18/2015   CLINICAL DATA:  General abdominal pain with nausea and vomiting. Leukocytosis.  EXAM: CT ABDOMEN AND PELVIS WITH CONTRAST  TECHNIQUE: Multidetector CT imaging of the abdomen and pelvis was performed using the standard protocol following bolus administration of intravenous contrast.  CONTRAST:  OMNIPAQUE IOHEXOL 300 MG/ML  SOLN  COMPARISON:  None currently available  FINDINGS: BODY WALL: Periumbilical hernia repair using mesh.  LOWER CHEST: No contributory findings.  ABDOMEN/PELVIS:  Liver: Heterogeneous  enhancement of the liver which may be from contrast timing. There are no definitive changes of cirrhosis. Two 4 mm hypervascular foci in the upper right liver are not seen on delayed imaging, likely small shunts. Two sub cm hypoenhancing foci on the delayed phase in the lower right liver are presumably reflects cysts.  Biliary: No evidence of biliary obstruction or stone.  Pancreas: Unremarkable.  Spleen: Unremarkable.  Adrenals: Unremarkable.  Kidneys and ureters: No hydronephrosis or stone.   Bladder: Unremarkable.  Reproductive: No pathologic findings.  Bowel: No obstruction. Prominent colonic wall thickness is likely from underdistention. No definitive colitis. Appendix not visualized. No pericecal inflammatory change.  Retroperitoneum: No mass or adenopathy.  Peritoneum: No ascites or pneumoperitoneum.  Vascular: Age advanced atherosclerotic plaque and irregularity of the aorta and iliacs.  OSSEOUS: No acute abnormalities. Focal L5-S1 degenerative disc disease.  IMPRESSION: No acute intra-abdominal findings.   Electronically Signed   By: Marnee Spring M.D.   On: 02/18/2015 01:10     EKG Interpretation   Date/Time:  Sunday February 17 2015 22:48:13 EDT Ventricular Rate:  59 PR Interval:  173 QRS Duration: 99 QT Interval:  615 QTC Calculation: 609 R Axis:   104 Text Interpretation:  Age not entered, assumed to be  54 years old for  purpose of ECG interpretation Sinus rhythm Right axis deviation Prolonged  QT interval Confirmed by Fayrene Fearing  MD, MARK ([id number]) on 02/17/2015 10:55:13 PM      1:07 AM This provider spoke with Dr. Nicholaus Bloom, Cardiologist, regarding patient's EKG and changes. Reported that since troponin not elevated nothing much left to do, but to monitor the patient. Reported that is anything changes to consult Cardiology. Reported that patient does not need to be seen tonight.  2:12 AM This provider spoke with Dr. Valentina Lucks, [facility]. Discussed case, labs, imaging, ED course, EKG in great detail. Patient to be admitted to Telemetry floor.    MDM   Final diagnoses:  Prolonged QT interval  Acute epigastric pain  Porphyria    Medications  iohexol (OMNIPAQUE) 300 MG/ML solution 100 mL (not administered)  sodium chloride 0.9 % bolus 1,000 mL (0 mLs Intravenous Stopped 02/17/15 2051)  HYDROmorphone (DILAUDID) injection 1 mg (1 mg Intravenous Given 02/17/15 1946)  ondansetron (ZOFRAN) injection 4 mg (4 mg Intravenous Given 02/17/15 2012)  sodium chloride 0.9 % bolus  1,000 mL (0 mLs Intravenous Stopped 02/17/15 2243)  HYDROmorphone (DILAUDID) injection 1 mg (1 mg Intravenous Given 02/17/15 2051)  HYDROmorphone (DILAUDID) injection 1 mg (1 mg Intravenous Given 02/17/15 2243)  iohexol (OMNIPAQUE) 300 MG/ML solution 100 mL (100 mLs Intravenous Contrast Given 02/18/15 0030)  metoCLOPramide (REGLAN) injection 5 mg (5 mg Intravenous Given 02/18/15 0121)    Filed Vitals:   02/17/15 2330 02/17/15 2346 02/18/15 0000 02/18/15 0122  BP: 179/108  198/92 133/91  Pulse: 51  48 57  Temp:  99 F (37.2 C)  98.2 F (36.8 C)  TempSrc:  Oral  Oral  Resp: SpO2: 97%  98% 98%   EKG noted prolonged QT interval with T-wave inversions with heart rate of 59 bpm. Troponin negative elevation. CBC noted elevated white blood cell count of 15.1. Hemoglobin 16.2, hematocrit 48.1. CMP noted mildly low potassium of 3.4. Glucose 161 with negative elevated in 9 gap. Lipase negative elevation. Magnesium unremarkable. Fecal occult positive. Lactic acid negative elevation. Urine pregnancy negative. Urinalysis noted moderate hemoglobin-negative nitrites, leukocytes-negative findings of infection. Moderate bilirubin with mildly elevated ketones and proteins  noted. CT abdomen and pelvis with contrast noted acute abnormalities identified. Patient presenting to the ED with abdominal pain, nausea, vomiting, and bloody stools x 3 days. Patient given IV fluids, IV pain medications, and IV antiemetics without relief. Patient continues to have pain and vomiting even after medications were administered and in ED for at least 8 hours. Hgb negative drop with negative findings of acute abnormalities noted on CT. Patient had EKG changes - T wave inversions and prolonged QT that is a new finding since 2001 - cardiology consulted no recommendations at this time. Patient to be admitted regarding pain control and EKG changes. Discussed case with Dr. Valentina Lucks. Woods - patient to be admitted to Telemetry floor. Patient agreed  to plan of admission. Patient stable for transfer to floor.   Raymon MuttonMarissa Leisha Trinkle, PA-C 02/18/15 0215  Raymon MuttonMarissa Addie Alonge, PA-C 02/18/15 [id number]  Blane OharaJoshua Zavitz, MD 02/18/15 1316

## 2015-02-18 ENCOUNTER — Inpatient Hospital Stay (HOSPITAL_COMMUNITY): Payer: Medicaid - Out of State

## 2015-02-18 ENCOUNTER — Emergency Department (HOSPITAL_COMMUNITY): Payer: Medicaid - Out of State

## 2015-02-18 ENCOUNTER — Encounter (HOSPITAL_COMMUNITY): Payer: Self-pay | Admitting: General Practice

## 2015-02-18 DIAGNOSIS — K921 Melena: Secondary | ICD-10-CM | POA: Diagnosis present

## 2015-02-18 DIAGNOSIS — R1013 Epigastric pain: Secondary | ICD-10-CM | POA: Diagnosis present

## 2015-02-18 DIAGNOSIS — F17210 Nicotine dependence, cigarettes, uncomplicated: Secondary | ICD-10-CM | POA: Diagnosis present

## 2015-02-18 DIAGNOSIS — R109 Unspecified abdominal pain: Secondary | ICD-10-CM

## 2015-02-18 DIAGNOSIS — F32A Depression, unspecified: Secondary | ICD-10-CM | POA: Diagnosis present

## 2015-02-18 DIAGNOSIS — R112 Nausea with vomiting, unspecified: Secondary | ICD-10-CM | POA: Diagnosis present

## 2015-02-18 DIAGNOSIS — I4581 Long QT syndrome: Secondary | ICD-10-CM | POA: Diagnosis present

## 2015-02-18 DIAGNOSIS — I4891 Unspecified atrial fibrillation: Secondary | ICD-10-CM

## 2015-02-18 DIAGNOSIS — G8929 Other chronic pain: Secondary | ICD-10-CM | POA: Diagnosis present

## 2015-02-18 DIAGNOSIS — L409 Psoriasis, unspecified: Secondary | ICD-10-CM | POA: Diagnosis present

## 2015-02-18 DIAGNOSIS — F329 Major depressive disorder, single episode, unspecified: Secondary | ICD-10-CM

## 2015-02-18 DIAGNOSIS — Z8673 Personal history of transient ischemic attack (TIA), and cerebral infarction without residual deficits: Secondary | ICD-10-CM | POA: Diagnosis not present

## 2015-02-18 DIAGNOSIS — F419 Anxiety disorder, unspecified: Secondary | ICD-10-CM | POA: Diagnosis not present

## 2015-02-18 DIAGNOSIS — D72829 Elevated white blood cell count, unspecified: Secondary | ICD-10-CM | POA: Diagnosis not present

## 2015-02-18 DIAGNOSIS — Z79899 Other long term (current) drug therapy: Secondary | ICD-10-CM | POA: Diagnosis not present

## 2015-02-18 DIAGNOSIS — Z681 Body mass index (BMI) 19 or less, adult: Secondary | ICD-10-CM | POA: Diagnosis not present

## 2015-02-18 DIAGNOSIS — A419 Sepsis, unspecified organism: Secondary | ICD-10-CM | POA: Diagnosis present

## 2015-02-18 DIAGNOSIS — I639 Cerebral infarction, unspecified: Secondary | ICD-10-CM

## 2015-02-18 DIAGNOSIS — I370 Nonrheumatic pulmonary valve stenosis: Secondary | ICD-10-CM

## 2015-02-18 DIAGNOSIS — Z8674 Personal history of sudden cardiac arrest: Secondary | ICD-10-CM | POA: Diagnosis not present

## 2015-02-18 DIAGNOSIS — Z888 Allergy status to other drugs, medicaments and biological substances status: Secondary | ICD-10-CM | POA: Diagnosis not present

## 2015-02-18 DIAGNOSIS — R569 Unspecified convulsions: Secondary | ICD-10-CM

## 2015-02-18 DIAGNOSIS — Z880 Allergy status to penicillin: Secondary | ICD-10-CM | POA: Diagnosis not present

## 2015-02-18 DIAGNOSIS — R1110 Vomiting, unspecified: Secondary | ICD-10-CM

## 2015-02-18 DIAGNOSIS — R1084 Generalized abdominal pain: Secondary | ICD-10-CM | POA: Diagnosis not present

## 2015-02-18 DIAGNOSIS — E43 Unspecified severe protein-calorie malnutrition: Secondary | ICD-10-CM | POA: Diagnosis present

## 2015-02-18 DIAGNOSIS — IMO0001 Reserved for inherently not codable concepts without codable children: Secondary | ICD-10-CM | POA: Insufficient documentation

## 2015-02-18 DIAGNOSIS — F418 Other specified anxiety disorders: Secondary | ICD-10-CM | POA: Diagnosis present

## 2015-02-18 DIAGNOSIS — R9431 Abnormal electrocardiogram [ECG] [EKG]: Secondary | ICD-10-CM | POA: Insufficient documentation

## 2015-02-18 DIAGNOSIS — Z79891 Long term (current) use of opiate analgesic: Secondary | ICD-10-CM | POA: Diagnosis not present

## 2015-02-18 DIAGNOSIS — I959 Hypotension, unspecified: Secondary | ICD-10-CM | POA: Diagnosis present

## 2015-02-18 DIAGNOSIS — I4892 Unspecified atrial flutter: Secondary | ICD-10-CM | POA: Diagnosis present

## 2015-02-18 LAB — TSH: TSH: 0.529 u[IU]/mL (ref 0.350–4.500)

## 2015-02-18 LAB — LACTIC ACID, PLASMA
LACTIC ACID, VENOUS: 2.3 mmol/L — AB (ref 0.5–2.0)
Lactic Acid, Venous: 1 mmol/L (ref 0.5–2.0)

## 2015-02-18 LAB — COMPREHENSIVE METABOLIC PANEL
ALK PHOS: 53 U/L (ref 39–117)
ALT: 12 U/L (ref 0–35)
AST: 18 U/L (ref 0–37)
Albumin: 4.1 g/dL (ref 3.5–5.2)
Anion gap: 12 (ref 5–15)
BILIRUBIN TOTAL: 0.8 mg/dL (ref 0.3–1.2)
BUN: 9 mg/dL (ref 6–23)
CO2: 20 mmol/L (ref 19–32)
CREATININE: 0.72 mg/dL (ref 0.50–1.10)
Calcium: 9 mg/dL (ref 8.4–10.5)
Chloride: 110 mmol/L (ref 96–112)
GFR calc non Af Amer: >90 mL/min (ref >90)
GLUCOSE: 141 mg/dL — AB (ref 70–99)
Potassium: 3.8 mmol/L (ref 3.5–5.1)
Sodium: 142 mmol/L (ref 135–145)
Total Protein: 7.2 g/dL (ref 6.0–8.3)

## 2015-02-18 LAB — CBC WITH DIFFERENTIAL/PLATELET
BASOS ABS: 0 10*3/uL (ref 0.0–0.1)
Basophils Relative: 0 % (ref 0–1)
Eosinophils Absolute: 0 10*3/uL (ref 0.0–0.7)
Eosinophils Relative: 0 % (ref 0–5)
HEMATOCRIT: 44.5 % (ref 36.0–46.0)
Hemoglobin: 14.8 g/dL (ref 12.0–15.0)
Lymphocytes Relative: 11 % — ABNORMAL LOW (ref 12–46)
Lymphs Abs: 1.7 10*3/uL (ref 0.7–4.0)
MCH: 30.3 pg (ref 26.0–34.0)
MCHC: 33.3 g/dL (ref 30.0–36.0)
MCV: 91.2 fL (ref 78.0–100.0)
Monocytes Absolute: 0.5 10*3/uL (ref 0.1–1.0)
Monocytes Relative: 3 % (ref 3–12)
Neutro Abs: 12.8 10*3/uL — ABNORMAL HIGH (ref 1.7–7.7)
Neutrophils Relative %: 86 % — ABNORMAL HIGH (ref 43–77)
Platelets: 244 10*3/uL (ref 150–400)
RBC: 4.88 MIL/uL (ref 3.87–5.11)
RDW: 13.5 % (ref 11.5–15.5)
WBC: 15 10*3/uL — ABNORMAL HIGH (ref 4.0–10.5)

## 2015-02-18 LAB — MAGNESIUM: Magnesium: 1.7 mg/dL (ref 1.5–2.5)

## 2015-02-18 LAB — PROCALCITONIN: Procalcitonin: <0.1 ng/mL

## 2015-02-18 MED ORDER — SODIUM CHLORIDE 0.9 % IJ SOLN
10.0000 mL | INTRAMUSCULAR | Status: DC | PRN
  Administered 2015-02-18 (×3): 10 mL
  Filled 2015-02-18 (×3): qty 40

## 2015-02-18 MED ORDER — HYDROMORPHONE HCL 1 MG/ML IJ SOLN: 0.5000 mg | Freq: Once | INTRAMUSCULAR | Status: DC

## 2015-02-18 MED ORDER — DIPHENHYDRAMINE HCL 12.5 MG/5ML PO ELIX: 12.5000 mg | ORAL_SOLUTION | Freq: Four times a day (QID) | ORAL | Status: DC | PRN

## 2015-02-18 MED ORDER — SODIUM CHLORIDE 0.9 % IJ SOLN: 9.0000 mL | INTRAMUSCULAR | Status: DC | PRN

## 2015-02-18 MED ORDER — NALOXONE HCL 0.4 MG/ML IJ SOLN: 0.4000 mg | INTRAMUSCULAR | Status: DC | PRN

## 2015-02-18 MED ORDER — IOHEXOL 300 MG/ML  SOLN
100.0000 mL | Freq: Once | INTRAMUSCULAR | Status: AC | PRN
  Administered 2015-02-18: 100 mL via INTRAVENOUS

## 2015-02-18 MED ORDER — CIPROFLOXACIN IN D5W 400 MG/200ML IV SOLN
400.0000 mg | Freq: Two times a day (BID) | INTRAVENOUS | Status: DC
  Administered 2015-02-18 – 2015-02-19 (×3): 400 mg via INTRAVENOUS
  Filled 2015-02-18 (×3): qty 200

## 2015-02-18 MED ORDER — CETYLPYRIDINIUM CHLORIDE 0.05 % MT LIQD: 7.0000 mL | Freq: Two times a day (BID) | OROMUCOSAL | Status: DC

## 2015-02-18 MED ORDER — CHLORHEXIDINE GLUCONATE 0.12 % MT SOLN
15.0000 mL | Freq: Two times a day (BID) | OROMUCOSAL | Status: DC
  Administered 2015-02-18: 15 mL via OROMUCOSAL
  Filled 2015-02-18: qty 15

## 2015-02-18 MED ORDER — METRONIDAZOLE IN NACL 5-0.79 MG/ML-% IV SOLN
500.0000 mg | Freq: Three times a day (TID) | INTRAVENOUS | Status: DC
  Administered 2015-02-18 – 2015-02-19 (×4): 500 mg via INTRAVENOUS
  Filled 2015-02-18 (×4): qty 100

## 2015-02-18 MED ORDER — METOCLOPRAMIDE HCL 5 MG/ML IJ SOLN
5.0000 mg | Freq: Four times a day (QID) | INTRAMUSCULAR | Status: DC
  Administered 2015-02-18 – 2015-02-19 (×6): 5 mg via INTRAVENOUS
  Filled 2015-02-18 (×6): qty 2

## 2015-02-18 MED ORDER — HYDROMORPHONE HCL 2 MG/ML IJ SOLN
2.0000 mg | INTRAMUSCULAR | Status: DC | PRN
  Administered 2015-02-18 – 2015-02-19 (×9): 2 mg via INTRAVENOUS
  Filled 2015-02-18 (×9): qty 1

## 2015-02-18 MED ORDER — SODIUM CHLORIDE 0.9 % IV SOLN
INTRAVENOUS | Status: DC
  Administered 2015-02-18 (×3): via INTRAVENOUS

## 2015-02-18 MED ORDER — SENNOSIDES-DOCUSATE SODIUM 8.6-50 MG PO TABS: 1.0000 | ORAL_TABLET | Freq: Every evening | ORAL | Status: DC | PRN

## 2015-02-18 MED ORDER — DIPHENHYDRAMINE HCL 50 MG/ML IJ SOLN: 12.5000 mg | Freq: Four times a day (QID) | INTRAMUSCULAR | Status: DC | PRN

## 2015-02-18 MED ORDER — HYDROMORPHONE 0.3 MG/ML IV SOLN
INTRAVENOUS | Status: DC
  Administered 2015-02-18: 18 mg via INTRAVENOUS
  Administered 2015-02-18: 03:00:00 via INTRAVENOUS
  Filled 2015-02-18: qty 25

## 2015-02-18 MED ORDER — MAGNESIUM CITRATE PO SOLN: 1.0000 | Freq: Once | ORAL | Status: AC | PRN

## 2015-02-18 MED ORDER — METOCLOPRAMIDE HCL 5 MG/ML IJ SOLN
5.0000 mg | Freq: Once | INTRAMUSCULAR | Status: AC
  Administered 2015-02-18: 5 mg via INTRAVENOUS
  Filled 2015-02-18: qty 2

## 2015-02-18 MED ORDER — ONDANSETRON HCL 4 MG/2ML IJ SOLN: 4.0000 mg | Freq: Four times a day (QID) | INTRAMUSCULAR | Status: DC | PRN

## 2015-02-18 MED ORDER — HYDROMORPHONE HCL 1 MG/ML IJ SOLN
1.0000 mg | Freq: Once | INTRAMUSCULAR | Status: AC
  Administered 2015-02-18: 1 mg via INTRAVENOUS
  Filled 2015-02-18: qty 1

## 2015-02-18 MED ORDER — CLONAZEPAM 1 MG PO TABS
2.0000 mg | ORAL_TABLET | Freq: Two times a day (BID) | ORAL | Status: DC
Start: 2015-02-18 — End: 2015-02-19
  Administered 2015-02-18 – 2015-02-19 (×3): 2 mg via ORAL
  Filled 2015-02-18 (×5): qty 2

## 2015-02-18 MED ORDER — HYDROMORPHONE 0.3 MG/ML IV SOLN: INTRAVENOUS | Status: DC

## 2015-02-18 NOTE — ED Notes (Signed)
Hospitalist at bedside 

## 2015-02-18 NOTE — Progress Notes (Addendum)
Patient ID: Lauren Atkinson, female   DOB: 06/16/1961, 53 y.o.   MRN: [id number]  Addendum to admission note done today 02/18/2015.  Patient seen and examined at the bedside.  53-year-old female with past medical history of porphyria, chronic abdominal pain, anxiety and depression, HIT, seizures. Patient presented to  hospital with complaints of worsening abdominal pain for last 3 days prior to this admission. Patient describes epigastric pain, sharp, stabbing, nonradiating, 10 out of 10 in intensity. She reports associated nausea and non bloody vomiting. Patient also reported having occasional bloody stools the Atkinson prior to the admission.  Physical exam: GEN: no distress Abd: non tender, non distended Chest: RRR, appreciate S1,S2  Assessment and Plan:  Sepsis / leukocytosis - Unknown source but possible GI - Sepsis criteria met on the admission with hypotension, HR 48, RR 22, low grade fever 99 F, leukocytosis of 15.1. No clear source although possible GI source so we will start cipro and flagyl until blood cultures, urine culture, c.diff back - I placed sepsis order set, follow up lactic acid and procalcitonin level   Abdominal pain, nausea and vomiting - Stop dilaudid PCA which is likely worsening hypotension - Use dilaudid 1-2 mg IV every 2 hours as needed for severe pain  Severe protein calorie malnutrition - Nutrition consulted     TRH [phone] 

## 2015-02-18 NOTE — Progress Notes (Signed)
INITIAL NUTRITION ASSESSMENT  DOCUMENTATION CODES Per approved criteria  -Severe  malnutrition in the context of social or environmental circumstances -Underweight  Pt meets criteria for severe MALNUTRITION in the context of social/environmental circumstances as evidenced by severe muscle and fat depletion, 15% weight loss x 2 months and energy intake <50% for >/=1 month.  INTERVENTION: Provide Magic cup TID with meals, each supplement provides 290 kcal and 9 grams of protein Encourage PO intake  NUTRITION DIAGNOSIS: Malnutrition related to social/environmental circumstances as evidenced by severe muscle and fat depletion, 15% weight loss x 2 months and energy intake <50% for >/=1 month.    Goal: Pt to meet >/= 90% of their estimated nutrition needs   Monitor:  Diet advancement, weight, labs, I/O's  Reason for Assessment: Consult for nutritional assessment  Admitting Dx: AIP (acute intermittent porphyria)  ASSESSMENT: 54 y/o WF PMHx acute porphyria, chronic abdominal pain, anxiety, depression, seizure, stroke, HIT.  Presenting to the ED with abdominal pain that started approximately 3 days ago and is progressing gotten worse. Patient reports that the pain is localized epigastric described as a sharp, stabbing pain without radiation. Reported that she started to experience nausea and vomiting today-when asked her many times patient has vomited patient reports "seems like 100," stated that she has been dry heaving for the past couple of times. Stated that she has been unable to keep any food or fluids down.  Pt states she has had poor appetite ever since her husband passed away 2 months ago. She states the stress has caused her some abdominal pain and as a result N/V. Pt became tearful describing how she did not want food because that was something her husband enjoyed and used as a comfort. Pt states that she would go every other day without eating and if she would eat she would only  have toast.  Pt has had 19 lb weight loss x 2 months (15%weight loss x 2 months, significant for time frame).  Pt states she does not like Ensure/Boost supplements because "they taste chalky". Encouraged pt to consume frequent small meals and snacks. Pt would like to have Magic cups sent to her. RD to order.  Nutrition Focused Physical Exam:  Subcutaneous Fat:  Orbital Region: WNL Upper Arm Region: moderate-severe depletion Thoracic and Lumbar Region: NA  Muscle:  Temple Region: mild-moderate depletion Clavicle Bone Region: mild-moderate depletion Clavicle and Acromion Bone Region: mild-moderate depletion Scapular Bone Region: NA Dorsal Hand: moderate-severe depletion Patellar Region: WNL Anterior Thigh Region: WNL Posterior Calf Region: mild depletion  Edema: no LE edema  Labs reviewed: WNL  Height: Ht Readings from Last 1 Encounters:  02/18/15 5\' 6"  (1.676 m)    Weight: Wt Readings from Last 1 Encounters:  02/18/15 104 lb 4.4 oz (47.3 kg)    Ideal Body Weight: 130 lb  % Ideal Body Weight: 80%  Wt Readings from Last 10 Encounters:  02/18/15 104 lb 4.4 oz (47.3 kg)    Usual Body Weight: 123 lb  % Usual Body Weight: 85%  BMI:  Body mass index is 16.84 kg/(m^2).  Estimated Nutritional Needs: Kcal: 1600-1800 Protein: 70-80g Fluid: 1.6L/day  Skin: intact  Diet Order: Diet regular Room service appropriate?: Yes; Fluid consistency:: Thin  EDUCATION NEEDS: -No education needs identified at this time   Intake/Output Summary (Last 24 hours) at 02/18/15 1404 Last data filed at 02/18/15 0637  Gross per 24 hour  Intake  472.5 ml  Output      0 ml  Net  472.5 ml    Last BM: 4/4 -bloody diarrhea  Labs:   Recent Labs Lab 02/16/15 1035 02/17/15 1925 02/17/15 2321 02/18/15 0510  NA 140 143  --  142  K 4.0 3.4*  --  3.8  CL 108 110  --  110  CO2 21 20  --  20  BUN 9 12  --  9  CREATININE 0.83 0.97  --  0.72  CALCIUM 9.5 9.7  --  9.0  MG  --   --   1.6 1.7  GLUCOSE 99 161*  --  141*    CBG (last 3)  No results for input(s): GLUCAP in the last 72 hours.  Scheduled Meds: . antiseptic oral rinse  7 mL Mouth Rinse q12n4p  . chlorhexidine  15 mL Mouth Rinse BID  . ciprofloxacin  400 mg Intravenous Q12H  . clonazePAM  2 mg Oral BID  . metoCLOPramide (REGLAN) injection  5 mg Intravenous 4 times per day  . metronidazole  500 mg Intravenous Q8H    Continuous Infusions: . sodium chloride 150 mL/hr at 02/18/15 0813    Past Medical History  Diagnosis Date  . Acute porphyria   . Chronic abdominal pain   . Anxiety   . Depression   . Seizures   . Stroke   . Dysrhythmia     a fib    Past Surgical History  Procedure Laterality Date  . Abdominal patch      Tilda Franco, MS, RD, LDN Pager: [phone] After Hours Pager: [phone]

## 2015-02-18 NOTE — H&P (Signed)
[facility] History and Physical  Lauren Atkinson ZOX:[id number] DOB: Feb 05, 1961 DOA: 02/17/2015  Referring physician:  PCP: Willey Blade, MD  Specialists:   Chief Complaint:   HPI: Lauren Atkinson is a 54 y/o WF PMHx  acute porphyria, chronic abdominal pain, anxiety, depression, seizure, stroke, HIT.  Presenting to the ED with abdominal pain that started approximately 3 days ago and is progressing gotten worse. Patient reports that the pain is localized epigastric described as a sharp, stabbing pain without radiation. Reported that she started to experience nausea and vomiting today-when asked her many times patient has vomited patient reports "seems like 100," stated that she has been dry heaving for the past couple of times. Stated that she has been unable to keep any food or fluids down. Patient reported that she has been having bloody stools starting today. Reported that she has been having generalize weakness. Stated that she was seen and assessed in ED setting yesterday, reported that she continues to have pain. Stated that she has an appointment with Dr. August Saucer, tomorrow. Stated that she has a port placed for approximately 3 years. Stated that in the past she has been given Dilaudid or high dose of morphine for her pain. Patient currently does not have a primary care provider. Denied fever, chills, chest pain, shortness of breath, difficulty breathing, blurred vision, sudden loss of vision, neck pain, neck stiffness, auditory visual hallucination. PCP Dr. August Saucer who she has not seen yet. Patient just returned to [place] 4 days ago secondary to husband's death. Has not established with hematologist. States her hematologist was in [place]. States brother has all of her records and took them home this evening. 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 Also states has had multiple episodes of arrhythmias  related to AIP to include A. fib, asystole. Unsure if she's had QT prolongation in the past.   Review of Systems: The patient denies anorexia, fever, weight loss,, vision loss, decreased hearing, hoarseness, chest pain, syncope, dyspnea on exertion, peripheral edema, balance deficits, hemoptysis,  melena, hematochezia, severe indigestion/heartburn, hematuria, incontinence, genital sores,  transient blindness, difficulty walking, unusual weight change, abnormal bleeding, enlarged lymph nodes, angioedema, and breast masses.    TRAVEL HISTORY:    Consultants:    Procedure/Significant Events:  4/4 CT abdomen pelvis with contrast; nondiagnostic for pain    Culture  4/4 blood pending 4/4 urine pending 4/4 C. difficile pending   Antibiotics:  NA   DVT prophylaxis:  SCD  Devices     LINES / TUBES:  Right chest wall port   Past Medical History  Diagnosis Date  . Acute porphyria   . Chronic abdominal pain   . Anxiety   . Depression   . Seizures   . Stroke    Past Surgical History  Procedure Laterality Date  . Abdominal patch     Social History:  reports that she has been smoking.  She does not have any smokeless tobacco history on file. She reports that she does not drink alcohol or use illicit drugs.    Allergies  Allergen Reactions  . Heparin   . Penicillins   . Toradol [Ketorolac Tromethamine]     No family history on file.   Prior to Admission medications   Medication Sig Start Date End Date Taking? Authorizing Provider  ALPRAZolam Prudy Feeler) 0.5 MG tablet Take 0.5 mg by mouth 2 (two) times daily as needed for anxiety.  Yes Historical Provider, MD  clonazePAM (KLONOPIN) 2 MG tablet Take 2 mg by mouth 2 (two) times daily.   Yes Historical Provider, MD  fentaNYL (DURAGESIC - DOSED MCG/HR) 75 MCG/HR Place 75 mcg onto the skin every 3 (three) days.   Yes Historical Provider, MD  OxyCODONE (OXYCONTIN) 15 mg T12A 12 hr tablet Take 15 mg by mouth every 12  (twelve) hours.   Yes Historical Provider, MD   Physical Exam: Filed Vitals:   02/18/15 0000 02/18/15 0122 02/18/15 0130 02/18/15 0230  BP: 198/92 133/91 173/90 177/81  Pulse: 48 57 59 53  Temp:  98.2 F (36.8 C)    TempSrc:  Oral    Resp: 13 20 11 12   SpO2: 98% 98% 96% 97%     General:  A/O 4, severe abdominal pain, cachectic  Eyes: Pupils equal round reactive to light and accommodation  Neck: Negative lymphadenopathy negative JVD  Cardiovascular: Bradycardic, regular rhythm, negative murmurs rubs or gallops, normal S1/S2  Respiratory: Clear to auscultation bilateral  Abdomen: Diffuse abdominal pain to palpation  Skin: Multiple tattoos  Musculoskeletal: Negative edema       Labs on Admission:  Basic Metabolic Panel:  Recent Labs Lab 02/16/15 1035 02/17/15 1925 02/17/15 2321  NA 140 143  --   K 4.0 3.4*  --   CL 108 110  --   CO2 21 20  --   GLUCOSE 99 161*  --   BUN 9 12  --   CREATININE 0.83 0.97  --   CALCIUM 9.5 9.7  --   MG  --   --  1.6   Liver Function Tests:  Recent Labs Lab 02/16/15 1035 02/17/15 1925  AST 17 20  ALT 8 13  ALKPHOS 55 62  BILITOT 0.7 0.9  PROT 7.2 8.1  ALBUMIN 4.0 4.5    Recent Labs Lab 02/16/15 1035 02/17/15 1925  LIPASE 41 43   No results for input(s): AMMONIA in the last 168 hours. CBC:  Recent Labs Lab 02/16/15 1035 02/17/15 1925  WBC 7.3 15.1*  NEUTROABS 4.9 12.9*  HGB 15.1* 16.2*  HCT 44.7 48.1*  MCV 89.4 90.9  PLT 225 281   Cardiac Enzymes:  Recent Labs Lab 02/17/15 2216  TROPONINI <0.03    BNP (last 3 results) No results for input(s): BNP in the last 8760 hours.  ProBNP (last 3 results) No results for input(s): PROBNP in the last 8760 hours.  CBG: No results for input(s): GLUCAP in the last 168 hours.  Radiological Exams on Admission: Ct Abdomen Pelvis W Contrast  02/18/2015   CLINICAL DATA:  General abdominal pain with nausea and vomiting. Leukocytosis.  EXAM: CT ABDOMEN AND  PELVIS WITH CONTRAST  TECHNIQUE: Multidetector CT imaging of the abdomen and pelvis was performed using the standard protocol following bolus administration of intravenous contrast.  CONTRAST:  100mL OMNIPAQUE IOHEXOL 300 MG/ML  SOLN  COMPARISON:  None currently available  FINDINGS: BODY WALL: Periumbilical hernia repair using mesh.  LOWER CHEST: No contributory findings.  ABDOMEN/PELVIS:  Liver: Heterogeneous enhancement of the liver which may be from contrast timing. There are no definitive changes of cirrhosis. Two 4 mm hypervascular foci in the upper right liver are not seen on delayed imaging, likely small shunts. Two sub cm hypoenhancing foci on the delayed phase in the lower right liver are presumably reflects cysts.  Biliary: No evidence of biliary obstruction or stone.  Pancreas: Unremarkable.  Spleen: Unremarkable.  Adrenals: Unremarkable.  Kidneys and ureters: No  hydronephrosis or stone.  Bladder: Unremarkable.  Reproductive: No pathologic findings.  Bowel: No obstruction. Prominent colonic wall thickness is likely from underdistention. No definitive colitis. Appendix not visualized. No pericecal inflammatory change.  Retroperitoneum: No mass or adenopathy.  Peritoneum: No ascites or pneumoperitoneum.  Vascular: Age advanced atherosclerotic plaque and irregularity of the aorta and iliacs.  OSSEOUS: No acute abnormalities. Focal L5-S1 degenerative disc disease.  IMPRESSION: No acute intra-abdominal findings.   Electronically Signed   By: Marnee Spring M.D.   On: 02/18/2015 01:10    EKG:   Assessment/Plan Principal Problem:   AIP (acute intermittent porphyria) Active Problems:   Intractable nausea and vomiting   Chronic abdominal pain   Anxiety   Depression   Seizure   CVA (cerebral infarction)   Atrial fibrillation   Psoriasis  AIP -Porphobilinogen random pending -Porphyrins fractionated pending -Dilaudid PCA -Normal saline 188ml/min -Will need to consult hematology in a.m. as  patient has not established care in this area; moved here 4 days ago  Intractable nausea vomiting -Zofran/Phenergan held secondary to prolonged QT interval -Continue Reglan IV 5 mg QID  Chronic abdominal pain -See AIP  Seizure -Is known to be a symptom of AIP, however currently asymptomatic. Watch closely for seizure activity  Atrial flutter ablation -Currently bradycardic with prolonged QT interval asymptomatic -Avoid all medication that exacerbate QT interval prolongation -Echocardiogram pending  Prolonged QT interval -See a flutter  CVA -Known to be symptom of AIP, however currently patient A/O 4 with no neurologic deficits -Would monitor closely but currently no workup warranted   Depression/anxiety -Continue Klonopin 2 mg BID -Would consider starting Paxil after patient is more stable, less likely to affect QT interval or exacerbate seizures   Psoriasis -Not currently on medication, would discuss starting topical after stable  Malnutrition -Nutrition consult placed   Code Status: Full Family Communication: None Disposition Plan: Verification and treatment of AIP  Time spent: 60 minute  WOODS, CURTIS J [facility] Pager [phone]  If 7PM-7AM, please contact night-coverage [email or web address] Password [email or web address] 02/18/2015, 3:19 AM

## 2015-02-18 NOTE — Progress Notes (Signed)
Echocardiogram 2D Echocardiogram has been performed.  Dorothey BasemanReel, Kayly Kriegel M 02/18/2015, 12:38 PM

## 2015-02-18 NOTE — Progress Notes (Addendum)
ANTIBIOTIC CONSULT NOTE - INITIAL  Pharmacy Consult for ciprofloxacin Indication: sepsis (likely intra-abdominal)  Allergies  Allergen Reactions  . Heparin   . Penicillins   . Toradol [Ketorolac Tromethamine]     Patient Measurements: Height: 5\' 6"  (167.6 cm) Weight: 104 lb 4.4 oz (47.3 kg) IBW/kg (Calculated) : 59.3 Adjusted Body Weight:   Vital Signs: Temp: 99 F (37.2 C) (04/04 0324) Temp Source: Oral (04/04 0324) BP: 90/50 mmHg (04/04 0640) Pulse Rate: 76 (04/04 0640) Intake/Output from previous day: 04/03 0701 - 04/04 0700 In: 472.5 [I.V.:472.5] Out: -  Intake/Output from this shift:    Labs:  Recent Labs  02/16/15 1035 02/17/15 1925 02/18/15 0510  WBC 7.3 15.1* 15.0*  HGB 15.1* 16.2* 14.8  PLT 225 281 244  CREATININE 0.83 0.97 0.72   Estimated Creatinine Clearance: 62.1 mL/min (by C-G formula based on Cr of 0.72). No results for input(s): VANCOTROUGH, VANCOPEAK, VANCORANDOM, GENTTROUGH, GENTPEAK, GENTRANDOM, TOBRATROUGH, TOBRAPEAK, TOBRARND, AMIKACINPEAK, AMIKACINTROU, AMIKACIN in the last 72 hours.   Microbiology: No results found for this or any previous visit (from the past 720 hour(s)).  Medical History: Past Medical History  Diagnosis Date  . Acute porphyria   . Chronic abdominal pain   . Anxiety   . Depression   . Seizures   . Stroke   . Dysrhythmia     a fib   Assessment: [age] YOF w/ history of porphyria, chronic abdominal pain presents with worsening abdominal pain. Meets SIRS criteria w/ concern of intra-abdominal source, so starting ciprofloxacin per Rx (and metronidazole per MD).   4/4 >> ciprofloxacin >> 4/4 >> metronidazole >>    Tmax: 99 WBCs: elevated Renal: SCr WNL  4/4/ blood: 4/4 urine:   Note QTc ~62200ms on 4/3pm EKG,  QTc 4/4am = 427ms   Goal of Therapy:  Dose for renal function  Plan:   Ciprofloxacin 400mg  IV q12h  No further adjustment needed at this time  Paged MD regarding QTc and suggested change to  ceftriaxone if remains concerned about QTc prolongation from admission (now resolved)  Procalcitonin ordered  Juliette Alcideustin Donneisha Beane, PharmD, BCPS.   Pager: [phone]  02/18/2015,11:32 AM

## 2015-02-19 DIAGNOSIS — R1084 Generalized abdominal pain: Secondary | ICD-10-CM

## 2015-02-19 LAB — URINE CULTURE
Colony Count: NO GROWTH
Culture: NO GROWTH

## 2015-02-19 LAB — LACTIC ACID, PLASMA
LACTIC ACID, VENOUS: 0.8 mmol/L (ref 0.5–2.0)
Lactic Acid, Venous: 1 mmol/L (ref 0.5–2.0)

## 2015-02-19 LAB — HEMOGLOBIN A1C
HEMOGLOBIN A1C: 5.4 % (ref 4.8–5.6)
MEAN PLASMA GLUCOSE: 108 mg/dL

## 2015-02-19 MED ORDER — OXYCODONE HCL ER 15 MG PO T12A
15.0000 mg | EXTENDED_RELEASE_TABLET | Freq: Two times a day (BID) | ORAL | Status: DC
Start: 2015-02-19 — End: 2017-01-14

## 2015-02-19 NOTE — Discharge Summary (Signed)
Physician Discharge Summary  Lauren Atkinson CFP:[id number] DOB: August 27, 1961 DOA: 02/17/2015  PCP: Willey Blade, MD  Admit date: 02/17/2015 Discharge date: 02/19/2015  Recommendations for Outpatient Follow-up:  1. Will follow up with PCP per sch appt 2. No new medications on discharge.  Discharge Diagnoses:  Principal Problem:   AIP (acute intermittent porphyria) Active Problems:   Intractable nausea and vomiting   Chronic abdominal pain   Anxiety   Depression   Seizure   CVA (cerebral infarction)   Atrial fibrillation   Psoriasis   Acute epigastric pain   Porphyria   Prolonged QT interval   Blood poisoning   Leukocytosis   Protein-calorie malnutrition, severe    Discharge Condition: stable; wants to go home today.   Diet recommendation: as tolerated   History of present illness:  54 year old female with past medical history of porphyria and related chronic abdominal pain, anxiety and depression, HIT, seizures. Patient presented to [facility] with complaints of worsening epigastric pain for past few days prior to this admission, 10/10 in intensity, non radiating, sharp, intermittent. Not relieved with analgesia at home. She also reported associated nausea and vomiting but no hematemesis. No fevers or chills. On admission, pt was hypotensive, bradycardic, tachypneic and with T max of 99 F. Blood work showed WBC count 15.1. Procalcitonin was WNL but lactic acid was 2.3. She was given empiric cipro and flagyl for possible GI source of sepsis.  Assessment and Plan:  Sepsis / leukocytosis - Sepsis criteria met on the admission with hypotension, HR 48, RR 22, low grade fever 99 F, leukocytosis of 15.1, lactic acid 2.3. Unknown source but thought to be GI related. - Was given cipro and flagyl when she was admitted.  - Blood cultures showed no growth.  - Confirmed with ID on call no need for abx on d/c since no source of infection, no fever  Abdominal pain, nausea and  vomiting - Unclear etiology, not sure if this could be porphyria or viral gastroenteritis - Stopped dilaudid PCA which was likely worsening hypotension. Used PRN dilaudid instead every 2 hours PRN - No diarrhea - Tolerated regular diet this am  Severe protein calorie malnutrition - Nutrition consulted    Signed:  Manson Passey, MD  [facility] 02/19/2015, 9:53 AM  Pager #: [phone]  Procedures:  None   Consultations:  None   Discharge Exam: Filed Vitals:   02/19/15 0616  BP: 92/63  Pulse: 66  Temp: 98.2 F (36.8 C)  Resp: 14   Filed Vitals:   02/18/15 0800 02/18/15 1433 02/18/15 2322 02/19/15 0616  BP:  85/52 103/52 92/63  Pulse:  75 59 66  Temp:  98.4 F (36.9 C) 98 F (36.7 C) 98.2 F (36.8 C)  TempSrc:  Oral Oral Oral  Resp: 13 14 16 14   Height:      Weight:    49.8 kg (109 lb 12.6 oz)  SpO2: 96% 95% 99% 98%    General: Pt is alert, follows commands appropriately, not in acute distress Cardiovascular: Regular rate and rhythm, S1/S2 +, no murmurs Respiratory: Clear to auscultation bilaterally, no wheezing, no crackles, no rhonchi Abdominal: Soft, non tender, non distended, bowel sounds +, no guarding Extremities: no edema, no cyanosis, pulses palpable bilaterally DP and PT Neuro: Grossly nonfocal  Discharge Instructions  Discharge Instructions    Call MD for:  difficulty breathing, headache or visual disturbances    Complete by:  As directed      Call MD for:  persistant  nausea and vomiting    Complete by:  As directed      Call MD for:  severe uncontrolled pain    Complete by:  As directed      Diet - low sodium heart healthy    Complete by:  As directed      Increase activity slowly    Complete by:  As directed             Medication List    TAKE these medications        ALPRAZolam 0.5 MG tablet  Commonly known as:  XANAX  Take 0.5 mg by mouth 2 (two) times daily as needed for anxiety.     clonazePAM 2 MG tablet  Commonly  known as:  KLONOPIN  Take 2 mg by mouth 2 (two) times daily.     fentaNYL 75 MCG/HR  Commonly known as:  DURAGESIC - dosed mcg/hr  Place 75 mcg onto the skin every 3 (three) days.     OxyCODONE 15 mg T12a 12 hr tablet  Commonly known as:  OXYCONTIN  Take 1 tablet (15 mg total) by mouth every 12 (twelve) hours.           Follow-up Information    Follow up with August Saucer, ERIC, MD. Schedule an appointment as soon as possible for a visit in 3 days.   Specialty:  Internal Medicine   Why:  Follow up appt after recent hospitalization   Contact information:   [address] [place] [address] [phone]        The results of significant diagnostics from this hospitalization (including imaging, microbiology, ancillary and laboratory) are listed below for reference.    Significant Diagnostic Studies: Ct Abdomen Pelvis W Contrast  02/18/2015   CLINICAL DATA:  General abdominal pain with nausea and vomiting. Leukocytosis.  EXAM: CT ABDOMEN AND PELVIS WITH CONTRAST  TECHNIQUE: Multidetector CT imaging of the abdomen and pelvis was performed using the standard protocol following bolus administration of intravenous contrast.  CONTRAST:  OMNIPAQUE IOHEXOL 300 MG/ML  SOLN  COMPARISON:  None currently available  FINDINGS: BODY WALL: Periumbilical hernia repair using mesh.  LOWER CHEST: No contributory findings.  ABDOMEN/PELVIS:  Liver: Heterogeneous enhancement of the liver which may be from contrast timing. There are no definitive changes of cirrhosis. Two 4 mm hypervascular foci in the upper right liver are not seen on delayed imaging, likely small shunts. Two sub cm hypoenhancing foci on the delayed phase in the lower right liver are presumably reflects cysts.  Biliary: No evidence of biliary obstruction or stone.  Pancreas: Unremarkable.  Spleen: Unremarkable.  Adrenals: Unremarkable.  Kidneys and ureters: No hydronephrosis or stone.  Bladder: Unremarkable.  Reproductive: No  pathologic findings.  Bowel: No obstruction. Prominent colonic wall thickness is likely from underdistention. No definitive colitis. Appendix not visualized. No pericecal inflammatory change.  Retroperitoneum: No mass or adenopathy.  Peritoneum: No ascites or pneumoperitoneum.  Vascular: Age advanced atherosclerotic plaque and irregularity of the aorta and iliacs.  OSSEOUS: No acute abnormalities. Focal L5-S1 degenerative disc disease.  IMPRESSION: No acute intra-abdominal findings.   Electronically Signed   By: Marnee Spring M.D.   On: 02/18/2015 01:10   Dg Chest Port 1 View  02/18/2015   CLINICAL DATA:  Acute porphyria.  Abdominal pain.  Sepsis.  EXAM: PORTABLE CHEST - 1 VIEW  COMPARISON:  02/18/2015 chest CT  FINDINGS: Power injectable Port-A-Cath noted, tip projecting over the SVC. Heart size within normal limits.  Upper zone pulmonary vasculature is probably secondary to non erect positioning. Tubing projects over the right upper chest.  No pleural effusion or airspace opacity identified.  IMPRESSION: 1. No acute thoracic findings. A thoracic cause for sepsis is not identified.   Electronically Signed   By: Van Clines M.D.   On: 02/18/2015 12:13    Microbiology: Recent Results (from the past 240 hour(s))  Culture, Urine     Status: None   Collection Time: 02/18/15  2:48 AM  Result Value Ref Range Status   Specimen Description URINE, CLEAN CATCH  Final   Special Requests NONE  Final   Colony Count NO GROWTH Performed at [facility]   Final   Culture NO GROWTH Performed at [facility]   Final   Report Status 02/19/2015 FINAL  Final  Culture, blood (routine x 2)     Status: None (Preliminary result)   Collection Time: 02/18/15  4:35 AM  Result Value Ref Range Status   Specimen Description BLOOD RIGHT HAND  Final   Special Requests BOTTLES DRAWN AEROBIC ONLY 3CC  Final   Culture   Final           BLOOD CULTURE RECEIVED NO GROWTH TO DATE CULTURE WILL BE HELD FOR 5  DAYS BEFORE ISSUING A FINAL NEGATIVE REPORT Performed at [facility]    Report Status PENDING  Incomplete  Culture, blood (routine x 2)     Status: None (Preliminary result)   Collection Time: 02/18/15  4:40 AM  Result Value Ref Range Status   Specimen Description BLOOD LEFT HAND  Final   Special Requests BOTTLES DRAWN AEROBIC ONLY 6CC  Final   Culture   Final           BLOOD CULTURE RECEIVED NO GROWTH TO DATE CULTURE WILL BE HELD FOR 5 DAYS BEFORE ISSUING A FINAL NEGATIVE REPORT Performed at [facility]    Report Status PENDING  Incomplete     Labs: Basic Metabolic Panel:  Recent Labs Lab 02/16/15 1035 02/17/15 1925 02/17/15 2321 02/18/15 0510  NA 140 143  --  142  K 4.0 3.4*  --  3.8  CL 108 110  --  110  CO2 21 20  --  20  GLUCOSE 99 161*  --  141*  BUN 9 12  --  9  CREATININE 0.83 0.97  --  0.72  CALCIUM 9.5 9.7  --  9.0  MG  --   --  1.6 1.7   Liver Function Tests:  Recent Labs Lab 02/16/15 1035 02/17/15 1925 02/18/15 0510  AST $Re'17 20 18  'PpB$ ALT $R'8 13 12  'Wu$ ALKPHOS 55 62 53  BILITOT 0.7 0.9 0.8  PROT 7.2 8.1 7.2  ALBUMIN 4.0 4.5 4.1    Recent Labs Lab 02/16/15 1035 02/17/15 1925  LIPASE 41 43   No results for input(s): AMMONIA in the last 168 hours. CBC:  Recent Labs Lab 02/16/15 1035 02/17/15 1925 02/18/15 0510  WBC 7.3 15.1* 15.0*  NEUTROABS 4.9 12.9* 12.8*  HGB 15.1* 16.2* 14.8  HCT 44.7 48.1* 44.5  MCV 89.4 90.9 91.2  PLT 225 281 244   Cardiac Enzymes:  Recent Labs Lab 02/17/15 2216  TROPONINI <0.03   BNP: BNP (last 3 results) No results for input(s): BNP in the last 8760 hours.  ProBNP (last 3 results) No results for input(s): PROBNP in the last 8760 hours.  CBG: No results for input(s): GLUCAP in the last 168 hours.  Time coordinating discharge: Over 30 minutes

## 2015-02-19 NOTE — Discharge Instructions (Signed)

## 2015-02-19 NOTE — Progress Notes (Signed)
Pt states that she is able to purchase her medications if they are on the $4.00 or lower list. Pt encouraged to use computer to find coupons.  Pt asked for discount card.  Will share medication assistance program information with her.

## 2015-02-22 LAB — PORPHOBILINOGEN, RANDOM URINE: Quantitative Porphobilinogen: 0 mg/g creat (ref <2.0)

## 2015-02-24 LAB — CULTURE, BLOOD (ROUTINE X 2)
CULTURE: NO GROWTH
Culture: NO GROWTH

## 2015-02-26 LAB — PORPHYRINS, FRACTIONATED, RANDOM URINE
COPROPORPHYRIN, RANDOM UR: 26 ug/g{creat} (ref 5.6–28.6)
Coproporphyrin III (PORFRU): 94 mcg/g creat — ABNORMAL HIGH (ref 4.1–76.4)
Heptacarboxyporphyrin, random ur: 3.8 mcg/g creat — ABNORMAL HIGH (ref <=2.9)
PENTACARBOXYPORPHYRIN, RANDOM UR: 2 ug/g{creat} (ref <=3.5)
Total Porphyrins, random ur: 137.2 mcg/g creat — ABNORMAL HIGH (ref 23.3–132.4)
UROPORPHYRIN III (PORFRU): 2.1 ug/g{creat} (ref <=4.8)
UROPORPHYRIN, RANDOM UR: 9.3 ug/g{creat} (ref 3.1–18.2)

## 2017-01-14 ENCOUNTER — Encounter (HOSPITAL_COMMUNITY): Payer: Self-pay | Admitting: Emergency Medicine

## 2017-01-14 ENCOUNTER — Emergency Department (HOSPITAL_COMMUNITY)
Admission: EM | Admit: 2017-01-14 | Discharge: 2017-01-14 | Disposition: A | Discharge status: Home / Self Care | Payer: Medicaid - Out of State | Attending: Emergency Medicine | Admitting: Emergency Medicine

## 2017-01-14 ENCOUNTER — Emergency Department (HOSPITAL_COMMUNITY): Payer: Medicaid - Out of State

## 2017-01-14 DIAGNOSIS — R079 Chest pain, unspecified: Secondary | ICD-10-CM | POA: Insufficient documentation

## 2017-01-14 DIAGNOSIS — R1010 Upper abdominal pain, unspecified: Secondary | ICD-10-CM | POA: Diagnosis not present

## 2017-01-14 DIAGNOSIS — F17210 Nicotine dependence, cigarettes, uncomplicated: Secondary | ICD-10-CM | POA: Insufficient documentation

## 2017-01-14 DIAGNOSIS — R1013 Epigastric pain: Secondary | ICD-10-CM | POA: Diagnosis present

## 2017-01-14 DIAGNOSIS — R112 Nausea with vomiting, unspecified: Secondary | ICD-10-CM | POA: Insufficient documentation

## 2017-01-14 LAB — CBC WITH DIFFERENTIAL/PLATELET
BASOS ABS: 0.2 10*3/uL — AB (ref 0.0–0.1)
Basophils Relative: 1 %
EOS PCT: 2 %
Eosinophils Absolute: 0.4 10*3/uL (ref 0.0–0.7)
HCT: 41.3 % (ref 36.0–46.0)
Hemoglobin: 14.2 g/dL (ref 12.0–15.0)
Lymphocytes Relative: 20 %
Lymphs Abs: 4.5 10*3/uL — ABNORMAL HIGH (ref 0.7–4.0)
MCH: 31.8 pg (ref 26.0–34.0)
MCHC: 34.4 g/dL (ref 30.0–36.0)
MCV: 92.4 fL (ref 78.0–100.0)
Monocytes Absolute: 0.9 10*3/uL (ref 0.1–1.0)
Monocytes Relative: 4 %
Neutro Abs: 16.5 10*3/uL — ABNORMAL HIGH (ref 1.7–7.7)
Neutrophils Relative %: 73 %
PLATELETS: 416 10*3/uL — AB (ref 150–400)
RBC: 4.47 MIL/uL (ref 3.87–5.11)
RDW: 13.7 % (ref 11.5–15.5)
WBC: 22.4 10*3/uL — AB (ref 4.0–10.5)

## 2017-01-14 LAB — COMPREHENSIVE METABOLIC PANEL
ALT: 10 U/L — ABNORMAL LOW (ref 14–54)
AST: 15 U/L (ref 15–41)
Albumin: 3.9 g/dL (ref 3.5–5.0)
Alkaline Phosphatase: 62 U/L (ref 38–126)
Anion gap: 11 (ref 5–15)
BILIRUBIN TOTAL: 0.3 mg/dL (ref 0.3–1.2)
BUN: 11 mg/dL (ref 6–20)
CO2: 19 mmol/L — ABNORMAL LOW (ref 22–32)
CREATININE: 0.72 mg/dL (ref 0.44–1.00)
Calcium: 9.1 mg/dL (ref 8.9–10.3)
Chloride: 110 mmol/L (ref 101–111)
Glucose, Bld: 142 mg/dL — ABNORMAL HIGH (ref 65–99)
Potassium: 3.3 mmol/L — ABNORMAL LOW (ref 3.5–5.1)
Sodium: 140 mmol/L (ref 135–145)
Total Protein: 7.7 g/dL (ref 6.5–8.1)

## 2017-01-14 LAB — LIPASE, BLOOD: Lipase: 35 U/L (ref 11–51)

## 2017-01-14 LAB — TROPONIN I: Troponin I: <0.03 ng/mL (ref <0.03)

## 2017-01-14 MED ORDER — IOPAMIDOL (ISOVUE-300) INJECTION 61%
INTRAVENOUS | Status: AC
  Filled 2017-01-14: qty 30

## 2017-01-14 MED ORDER — HYDROMORPHONE HCL 2 MG/ML IJ SOLN
1.5000 mg | Freq: Once | INTRAMUSCULAR | Status: AC
  Administered 2017-01-14: 1.5 mg via INTRAVENOUS
  Filled 2017-01-14: qty 1

## 2017-01-14 MED ORDER — SODIUM CHLORIDE 0.9 % IV BOLUS (SEPSIS)
2000.0000 mL | Freq: Once | INTRAVENOUS | Status: AC
  Administered 2017-01-14: 1000 mL via INTRAVENOUS

## 2017-01-14 MED ORDER — HEPARIN SOD (PORK) LOCK FLUSH 100 UNIT/ML IV SOLN
INTRAVENOUS | Status: AC
  Administered 2017-01-14: 17:00:00
  Filled 2017-01-14: qty 5

## 2017-01-14 MED ORDER — PROCHLORPERAZINE MALEATE 10 MG PO TABS: 10.0000 mg | ORAL_TABLET | Freq: Two times a day (BID) | ORAL | 0 refills | Status: DC | PRN

## 2017-01-14 MED ORDER — METOCLOPRAMIDE HCL 5 MG/ML IJ SOLN
10.0000 mg | Freq: Once | INTRAMUSCULAR | Status: AC
Start: 2017-01-14 — End: 2017-01-14
  Administered 2017-01-14: 10 mg via INTRAVENOUS
  Filled 2017-01-14: qty 2

## 2017-01-14 MED ORDER — HYDROMORPHONE HCL 1 MG/ML IJ SOLN
1.0000 mg | Freq: Once | INTRAMUSCULAR | Status: AC
  Administered 2017-01-14: 1 mg via INTRAVENOUS
  Filled 2017-01-14: qty 1

## 2017-01-14 MED ORDER — OXYCODONE-ACETAMINOPHEN 5-325 MG PO TABS: 1.0000 | ORAL_TABLET | Freq: Four times a day (QID) | ORAL | 0 refills | Status: DC | PRN

## 2017-01-14 MED ORDER — IOPAMIDOL (ISOVUE-300) INJECTION 61%
100.0000 mL | Freq: Once | INTRAVENOUS | Status: AC | PRN
  Administered 2017-01-14: 100 mL via INTRAVENOUS

## 2017-01-14 NOTE — ED Provider Notes (Signed)
[facility]-EMERG[facility]Y DEPT Provider Note   CSN: [id number] Arrival date & time: 01/14/17  1307   By signing my name below, I, Lauren Atkinson, attest that this documentation has been prepared under the direction and in the presence of Bethann BerkshireJoseph Prachi Oftedahl, MD. Electronically Signed: Bobbie Stackhristopher Atkinson, Scribe. 01/14/17. 1:43 PM. History   Chief Complaint Chief Complaint  Patient presents with  . Abdominal Pain    HPI Comments: Lauren Atkinson is a 56 y.o. female who presents to the Emergency Department complaining of epigastric abdominal pain and chest pain that began around 1 hour ago. The patient is in severe pain. She also reports nausea and vomiting. 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 She states that she is having a flair up and that this happens quite frequently. She reports that this kind of pain is typical when she has these flair ups. Her last episode was noted to be in November of 2017 and she had to be admitted at that time. She report no alleviating factors for her pain.  The history is provided by the patient. No language interpreter was used.  Abdominal Pain   This is a new problem. The current episode started 1 to 2 hours ago. The problem occurs constantly. The problem has been gradually worsening. The pain is located in the epigastric region. The quality of the pain is sharp. The pain is at a severity of 10/10. The pain is moderate. Associated symptoms include nausea and vomiting. Pertinent negatives include diarrhea, frequency, hematuria and headaches. Past medical history comments: Patient has Porphyria.    Past Medical History:  Diagnosis Date  . Acute porphyria (HCC)   . Anxiety   . Chronic abdominal pain   . Depression   . Dysrhythmia    a fib  . Seizures (HCC)   . Stroke [facility](HCC)     Patient Active Problem List   Diagnosis Date Noted  . Generalized abdominal pain   . Intractable nausea and vomiting 02/18/2015  . 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  Chronic abdominal pain 02/18/2015  . Anxiety 02/18/2015  . Depression 02/18/2015  . Seizure (HCC) 02/18/2015  . CVA (cerebral infarction) 02/18/2015  . Atrial fibrillation (HCC) 02/18/2015  . Psoriasis 02/18/2015  . Protein-calorie malnutrition, severe (HCC) 02/18/2015  . Acute epigastric pain   . Porphyria (HCC)   . Prolonged QT interval   . Blood poisoning (HCC)   . Leukocytosis     Past Surgical History:  Procedure Laterality Date  . abdominal patch      OB History    No data available       Home Medications    Prior to Admission medications   Medication Sig Start Date End Date Taking? Authorizing Provider  ALPRAZolam Prudy Feeler(XANAX) 0.5 MG tablet Take 0.5 mg by mouth 2 (two) times daily as needed for anxiety.    Historical Provider, MD  clonazePAM (KLONOPIN) 2 MG tablet Take 2 mg by mouth 2 (two) times daily.    Historical Provider, MD  fentaNYL (DURAGESIC - DOSED MCG/HR) 75 MCG/HR Place 75 mcg onto the skin every 3 (three) days.    Historical Provider, MD  OxyCODONE (OXYCONTIN) 15 mg T12A 12 hr tablet Take 1 tablet (15 mg total) by mouth every 12 (twelve) hours. 02/19/15   Alison MurrayAlma M Devine, MD    Family History History reviewed. No pertinent family history.  Social History Social History  Substance Use Topics  . Smoking status: Current Every Day Smoker    Packs/day: 1.00  Types: Cigarettes  . Smokeless tobacco: Not on file  . Alcohol use No     Allergies   Heparin; Penicillins; and Toradol [ketorolac tromethamine]   Review of Systems Review of Systems  Constitutional: Negative for appetite change and fatigue.  HENT: Negative for congestion, ear discharge and sinus pressure.   Eyes: Negative for discharge.  Respiratory: Negative for cough.   Cardiovascular: Positive for chest pain.  Gastrointestinal: Positive for abdominal pain, nausea and vomiting. Negative for diarrhea.  Genitourinary: Negative for frequency and hematuria.  Musculoskeletal: Negative for back  pain.  Skin: Negative for rash.  Neurological: Negative for seizures and headaches.  Psychiatric/Behavioral: Negative for hallucinations.     Physical Exam Updated Vital Signs BP (!) 188/113 (BP Location: Left Arm)   Pulse 69   Temp 97.8 F (36.6 C) (Oral)   Resp 18   Ht 5\' 6"  (1.676 m)   Wt 110 lb (49.9 kg)   SpO2 100%   BMI 17.75 kg/m   Physical Exam  Constitutional: She is oriented to person, place, and time. She appears well-developed.  HENT:  Head: Normocephalic.  Eyes: Conjunctivae and EOM are normal. No scleral icterus.  Neck: Neck supple. No thyromegaly present.  Cardiovascular: Normal rate and regular rhythm.  Exam reveals no gallop and no friction rub.   No murmur heard. Pulmonary/Chest: No stridor. She has no wheezes. She has no rales. She exhibits no tenderness.  Abdominal: She exhibits no distension. There is tenderness. There is no rebound.  Moderate epigastric tenderness.  Musculoskeletal: Normal range of motion. She exhibits no edema.  Lymphadenopathy:    She has no cervical adenopathy.  Neurological: She is oriented to person, place, and time. She exhibits normal muscle tone. Coordination normal.  Skin: No rash noted. No erythema.  Psychiatric: She has a normal mood and affect. Her behavior is normal.     ED Treatments / Results  DIAGNOSTIC STUDIES: Oxygen Saturation is 100% on RA, normal by my interpretation.    COORDINATION OF CARE: 1:31 PM Discussed treatment plan with pt at bedside and pt agreed to plan. I will check the patient's labs and CT scan.  Labs (all labs ordered are listed, but only abnormal results are displayed) Labs Reviewed  COMPREHENSIVE METABOLIC PANEL  CBC WITH DIFFERENTIAL/PLATELET  TROPONIN I    EKG  EKG Interpretation None       Radiology No results found.  Procedures Procedures (including critical care time)  Medications Ordered in ED Medications  sodium chloride 0.9 % bolus 2,000 mL (not administered)    HYDROmorphone (DILAUDID) injection 1 mg (not administered)  metoCLOPramide (REGLAN) injection 10 mg (not administered)     Initial Impression / Assessment and Plan / ED Course  I have reviewed the triage vital signs and the nursing notes.  Pertinent labs & imaging results that were available during my care of the patient were reviewed by me and considered in my medical decision making (see chart for details).     Patient with history of porphyria and severe abdominal pain. Patient has improved with 2 shots of Dilaudid. She states she is ready to go home. Patient is referred to GI will follow-up as needed  Final Clinical Impressions(s) / ED Diagnoses   Final diagnoses:  None    New Prescriptions New Prescriptions   No medications on file   The chart was scribed for me under my direct supervision.  I personally performed the history, physical, and medical decision making and all procedures in the  evaluation of this patient.Bethann Berkshire, MD 01/14/17 [phone]

## 2017-01-14 NOTE — Discharge Instructions (Signed)
Follow-up with her family doctor or Dr.Rourke if any problems

## 2017-01-14 NOTE — ED Notes (Signed)
Patient transported to CT 

## 2017-01-14 NOTE — ED Triage Notes (Signed)
Pt states she stopped her meds a few months ago.

## 2017-01-14 NOTE — ED Notes (Signed)
Pt verbalized understanding of no driving and to use caution within 4 hours of taking pain meds due to meds cause drowsiness 

## 2017-01-14 NOTE — ED Triage Notes (Signed)
Pt reports n/v and abd pain beginning about 1 hour ago.  Brother states this happens frequently and pt has significant related hx.

## 2017-01-27 ENCOUNTER — Encounter (HOSPITAL_COMMUNITY): Payer: Self-pay | Admitting: Emergency Medicine

## 2017-01-27 ENCOUNTER — Emergency Department (HOSPITAL_COMMUNITY)
Admission: EM | Admit: 2017-01-27 | Discharge: 2017-01-28 | Disposition: A | Discharge status: Home / Self Care | Payer: Medicaid - Out of State | Source: Home / Self Care | Attending: Emergency Medicine | Admitting: Emergency Medicine

## 2017-01-27 DIAGNOSIS — F17210 Nicotine dependence, cigarettes, uncomplicated: Secondary | ICD-10-CM | POA: Insufficient documentation

## 2017-01-27 DIAGNOSIS — R197 Diarrhea, unspecified: Secondary | ICD-10-CM | POA: Insufficient documentation

## 2017-01-27 DIAGNOSIS — R1084 Generalized abdominal pain: Secondary | ICD-10-CM | POA: Diagnosis not present

## 2017-01-27 DIAGNOSIS — R1013 Epigastric pain: Secondary | ICD-10-CM

## 2017-01-27 DIAGNOSIS — R109 Unspecified abdominal pain: Principal | ICD-10-CM

## 2017-01-27 DIAGNOSIS — F1290 Cannabis use, unspecified, uncomplicated: Secondary | ICD-10-CM | POA: Diagnosis not present

## 2017-01-27 DIAGNOSIS — G8929 Other chronic pain: Secondary | ICD-10-CM

## 2017-01-27 DIAGNOSIS — F419 Anxiety disorder, unspecified: Secondary | ICD-10-CM | POA: Diagnosis not present

## 2017-01-27 DIAGNOSIS — R110 Nausea: Secondary | ICD-10-CM | POA: Diagnosis not present

## 2017-01-27 DIAGNOSIS — R112 Nausea with vomiting, unspecified: Secondary | ICD-10-CM

## 2017-01-27 MED ORDER — METOCLOPRAMIDE HCL 5 MG/ML IJ SOLN
10.0000 mg | Freq: Once | INTRAMUSCULAR | Status: AC
  Administered 2017-01-28: 10 mg via INTRAVENOUS
  Filled 2017-01-27: qty 2

## 2017-01-27 MED ORDER — HYDROMORPHONE HCL 1 MG/ML IJ SOLN
1.0000 mg | Freq: Once | INTRAMUSCULAR | Status: AC
  Administered 2017-01-27: 1 mg via INTRAVENOUS
  Filled 2017-01-27: qty 1

## 2017-01-27 MED ORDER — SODIUM CHLORIDE 0.9 % IV BOLUS (SEPSIS)
1000.0000 mL | Freq: Once | INTRAVENOUS | Status: AC
  Administered 2017-01-28: 1000 mL via INTRAVENOUS

## 2017-01-27 MED ORDER — LORAZEPAM 2 MG/ML IJ SOLN
1.0000 mg | Freq: Once | INTRAMUSCULAR | Status: AC
  Administered 2017-01-27: 1 mg via INTRAVENOUS
  Filled 2017-01-27: qty 1

## 2017-01-27 NOTE — ED Triage Notes (Signed)
Pt c/o abd pain with vomiting and one episode of diarrhea that started tonight.

## 2017-01-27 NOTE — ED Provider Notes (Signed)
[facility] Provider Note   CSN: [id number] Arrival date & time: 01/27/17  2308  By signing my name below, I, Diona Browner, attest that this documentation has been prepared under the direction and in the presence of Glynn Octave, MD. Electronically Signed: Diona Browner, ED Scribe. 01/27/17. 11:58 PM.   History   Chief Complaint Chief Complaint  Patient presents with  . Abdominal Pain    HPI Lauren Atkinson is a 56 y.o. female with a PMHX of porphyria, chronic abdominal pain who presents to the Emergency Department complaining of constant, severe abdominal pain for the last 1.5 hours. Associated sx include vomiting, diarrhea, and CP. Movement exacrbates pain. Pt states using marijuana whenever possible for pain. Was seen in the ED ~ two weeks ago for similar onset and last November 2017 before that. Pt denies fever. Does not have any pain medications at home.  Received pain meds in the ED on 3/1.   PCP: Gwenyth Bender, MD   The history is provided by the patient. No language interpreter was used.    Past Medical History:  Diagnosis Date  . Acute porphyria (HCC)   . Anxiety   . Chronic abdominal pain   . Depression   . Dysrhythmia    a fib  . Seizures (HCC)   . Stroke [facility])     Patient Active Problem List   Diagnosis Date Noted  . Generalized abdominal pain   . Intractable nausea and vomiting 02/18/2015  . 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 Chronic abdominal pain 02/18/2015  . Anxiety 02/18/2015  . Depression 02/18/2015  . Seizure (HCC) 02/18/2015  . CVA (cerebral infarction) 02/18/2015  . Atrial fibrillation (HCC) 02/18/2015  . Psoriasis 02/18/2015  . Protein-calorie malnutrition, severe (HCC) 02/18/2015  . Acute epigastric pain   . Porphyria (HCC)   . Prolonged QT interval   . Blood poisoning (HCC)   . Leukocytosis     Past Surgical History:  Procedure Laterality Date  . abdominal patch      OB History    No data  available       Home Medications    Prior to Admission medications   Medication Sig Start Date End Date Taking? Authorizing Provider  oxyCODONE-acetaminophen (PERCOCET/ROXICET) 5-325 MG tablet Take 1 tablet by mouth every 6 (six) hours as needed. 01/14/17   Bethann Berkshire, MD  prochlorperazine (COMPAZINE) 10 MG tablet Take 1 tablet (10 mg total) by mouth 2 (two) times daily as needed for nausea or vomiting. 01/14/17   Bethann Berkshire, MD    Family History No family history on file.  Social History Social History  Substance Use Topics  . Smoking status: Current Every Day Smoker    Packs/day: 1.00    Types: Cigarettes  . Smokeless tobacco: Never Used  . Alcohol use No     Allergies   Heparin; Penicillins; and Toradol [ketorolac tromethamine]   Review of Systems Review of Systems  Gastrointestinal: Positive for abdominal pain.   A complete 10 system review of systems was obtained and all systems are negative except as noted in the HPI and PMH.    Physical Exam Updated Vital Signs BP (!) 192/124   Pulse 60   Temp 97.5 F (36.4 C) (Oral)   Resp 20   Ht 5\' 6"  (1.676 m)   Wt 110 lb (49.9 kg)   SpO2 100%   BMI 17.75 kg/m   Physical Exam  Constitutional: She is oriented to person, place, and time.  She appears well-developed and well-nourished. She appears distressed.  Distress from pain, rocking on bed. Diaphoretic Will not allow proper exam due to discomfort.   HENT:  Head: Normocephalic and atraumatic.  Right Ear: Hearing normal.  Left Ear: Hearing normal.  Nose: Nose normal.  Mouth/Throat: Oropharynx is clear and moist and mucous membranes are normal.  Eyes: Conjunctivae and EOM are normal. Pupils are equal, round, and reactive to light.  Neck: Normal range of motion. Neck supple.  Cardiovascular: Regular rhythm, S1 normal and S2 normal.  Exam reveals no gallop and no friction rub.   No murmur heard. Pulmonary/Chest: Effort normal and breath sounds normal. No  respiratory distress. She exhibits no tenderness.  Abdominal: Soft. Normal appearance and bowel sounds are normal. There is no hepatosplenomegaly. There is tenderness (epigastric). There is no rebound, no guarding, no tenderness at McBurney's point and negative Murphy's sign. No hernia.  Musculoskeletal: Normal range of motion.  Neurological: She is alert and oriented to person, place, and time. She has normal strength. No cranial nerve deficit or sensory deficit. Coordination normal. GCS eye subscore is 4. GCS verbal subscore is 5. GCS motor subscore is 6.  Skin: Skin is warm, dry and intact. No rash noted. No cyanosis.  Psychiatric: She has a normal mood and affect. Her speech is normal and behavior is normal. Thought content normal.  Nursing note and vitals reviewed.    ED Treatments / Results  DIAGNOSTIC STUDIES: Oxygen Saturation is 100% on RA, normal by my interpretation.   COORDINATION OF CARE: 11:45 PM-Discussed next steps with pt. Pt verbalized understanding and is agreeable with the plan.    Labs (all labs ordered are listed, but only abnormal results are displayed) Labs Reviewed  URINALYSIS, ROUTINE W REFLEX MICROSCOPIC - Abnormal; Notable for the following:       Result Value   Glucose, UA 50 (*)    Hgb urine dipstick SMALL (*)    Ketones, ur 5 (*)    All other components within normal limits  CBC WITH DIFFERENTIAL/PLATELET - Abnormal; Notable for the following:    WBC 20.0 (*)    Platelets 468 (*)    Neutro Abs 12.3 (*)    Lymphs Abs 6.0 (*)    All other components within normal limits  COMPREHENSIVE METABOLIC PANEL - Abnormal; Notable for the following:    Potassium 3.2 (*)    CO2 19 (*)    Glucose, Bld 185 (*)    AST 14 (*)    ALT 8 (*)    All other components within normal limits  RAPID URINE DRUG SCREEN, HOSP PERFORMED - Abnormal; Notable for the following:    Opiates POSITIVE (*)    Tetrahydrocannabinol POSITIVE (*)    All other components within normal  limits  PREGNANCY, URINE  LIPASE, BLOOD  TROPONIN I    EKG  EKG Interpretation  Date/Time:  Thursday January 28 2017 00:08:34 EDT Ventricular Rate:  98 PR Interval:    QRS Duration: 99 QT Interval:  406 QTC Calculation: 413 R Axis:   115 Text Interpretation:  Sinus rhythm Ventricular bigeminy Biatrial enlargement Left posterior fascicular block Borderline T abnormalities, anterior leads new PVCs Confirmed by Manus Gunning  MD, Brystal Kildow [phone]) on 01/28/2017 1:08:29 AM       Radiology Ct Abdomen Pelvis W Contrast  Result Date: 01/28/2017 CLINICAL DATA:  Diffuse abdominal pain, leukocytosis.  Porphyria. EXAM: CT ABDOMEN AND PELVIS WITH CONTRAST TECHNIQUE: Multidetector CT imaging of the abdomen and pelvis was performed using  the standard protocol following bolus administration of intravenous contrast. CONTRAST:  ISOVUE-300 IOPAMIDOL (ISOVUE-300) INJECTION 61% COMPARISON:  Acute abdominal series January 28, 2017 at 0120 hours and CT abdomen and pelvis January 14, 2017 FINDINGS: LOWER CHEST: Lung bases are clear. Included heart size is normal. No pericardial effusion. HEPATOBILIARY: A few scattered subcentimeter hypodensities in liver most compatible with cysts. Normal gallbladder. PANCREAS: Normal. SPLEEN: Normal. ADRENALS/URINARY TRACT: Kidneys are orthotopic, demonstrating symmetric enhancement. No nephrolithiasis, hydronephrosis or solid renal masses. Too small to characterize hypodensity RIGHT interpolar kidney. The unopacified ureters are normal in course and caliber. Delayed imaging through the kidneys demonstrates symmetric prompt contrast excretion within the proximal urinary collecting system. Urinary bladder is well distended and unremarkable. Normal adrenal glands. STOMACH/BOWEL: Small hiatal hernia. Diffuse mild colonic wall thickening though colon is decompressed, limited assessment without oral contrast. The stomach, small bowel are normal in course and caliber without inflammatory changes.  Normal appendix. VASCULAR/LYMPHATIC: Aortoiliac vessels are normal in course and caliber, severe calcific atherosclerosis in mild stenosis infrarenal aorta. No lymphadenopathy by CT size criteria. REPRODUCTIVE: Normal. OTHER: No intraperitoneal free fluid or free air. MUSCULOSKELETAL: Nonacute. Status post anterior abdominal wall herniorrhaphy with mesh. Moderate bilateral hip osteoarthrosis with superimposed curvilinear sclerosis femoral heads. Old L5-S1 disc extrusion with mineralization. IMPRESSION: Mild suspected colitis, assessment limited by lack of oral contrast. 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 Severe atherosclerosis. Electronically Signed   By: Awilda Metro M.D.   On: 01/28/2017 03:39   Dg Abdomen Acute W/chest  Result Date: 01/28/2017 CLINICAL DATA:  Severe acute on chronic abdominal pain, vomiting and diarrhea. History of porphyria. EXAM: DG ABDOMEN ACUTE W/ 1V CHEST COMPARISON:  CT abdomen and pelvis January 14, 2017 inch chest radiograph February 18, 2015 FINDINGS: Cardiac silhouette is upper limits of normal in size. Mediastinal silhouette is nonsuspicious. Increased lung volumes with mild chronic interstitial changes, unchanged. No pleural effusion or focal consolidation. Single lumen RIGHT chest Port-A-Cath with distal tip at cavoatrial junction. Similar fullness of pulmonary hilar consistent with vascular shadows. Osteopenia. Bowel gas pattern is nondilated and nonobstructive. No intra-abdominal mass effect, pathologic calcifications or free air. Soft tissue planes and included osseous structures are non-suspicious. Mild vascular calcifications. Status post abdominal herniorrhaphy. IMPRESSION: Borderline cardiomegaly and probable COPD. Nonspecific bowel gas pattern. Electronically Signed   By: Awilda Metro M.D.   On: 01/28/2017 02:31    Procedures Procedures (including critical care time)  Medications Ordered in ED Medications - No data to  display   Initial Impression / Assessment and Plan / ED Course  I have reviewed the triage vital signs and the nursing notes.  Pertinent labs & imaging results that were available during my care of the patient were reviewed by me and considered in my medical decision making (see chart for details).     Severe abdominal pain with nausea and vomiting similar to previous episodes of porphyria. Seen in the ED 2 weeks ago for same.  She is in distress, diaphoretic but soft abdomen. CT scan from March 1 reviewed.  Labs show leukocytosis which appears to be chronic. Hyperglycemia without DKA. UA negative.  AAS negative.  CT from March 1 reviewed.  Given ongoing abdominal pain with leukocytosis, will obtain CT again.  Patient refuses to drink PO contrast.  CT shows mild diffuse colonic wall thickening though limited by lack of by mouth contrast. No other acute findings. Patient tolerating by mouth. No vomiting in the ED. Blood pressure and heart rate are much improved.  Patient referred to PCP as well as GI. She understands she'll not be given narcotics for chronic painful conditions. Stop using marijuana.  Return precautions discussed.  BP 112/77   Pulse 86   Temp 97.5 F (36.4 C) (Oral)   Resp 18   Ht 5\' 6"  (1.676 m)   Wt 110 lb (49.9 kg)   SpO2 100%   BMI 17.75 kg/m    Final Clinical Impressions(s) / ED Diagnoses   Final diagnoses:  Chronic abdominal pain    New Prescriptions New Prescriptions   No medications on file   I personally performed the services described in this documentation, which was scribed in my presence. The recorded information has been reviewed and is accurate.    Glynn OctaveStephen Tabathia Knoche, MD 01/28/17 [phone]

## 2017-01-28 ENCOUNTER — Observation Stay (HOSPITAL_COMMUNITY)
Admission: EM | Admit: 2017-01-28 | Discharge: 2017-01-29 | Disposition: A | Discharge status: Home / Self Care | Payer: Medicaid - Out of State | Attending: Internal Medicine | Admitting: Internal Medicine

## 2017-01-28 ENCOUNTER — Emergency Department (HOSPITAL_COMMUNITY): Payer: Medicaid - Out of State

## 2017-01-28 ENCOUNTER — Encounter (HOSPITAL_COMMUNITY): Payer: Self-pay | Admitting: *Deleted

## 2017-01-28 DIAGNOSIS — F17210 Nicotine dependence, cigarettes, uncomplicated: Secondary | ICD-10-CM | POA: Diagnosis not present

## 2017-01-28 DIAGNOSIS — R110 Nausea: Secondary | ICD-10-CM | POA: Diagnosis not present

## 2017-01-28 DIAGNOSIS — R1084 Generalized abdominal pain: Principal | ICD-10-CM | POA: Insufficient documentation

## 2017-01-28 DIAGNOSIS — R739 Hyperglycemia, unspecified: Secondary | ICD-10-CM | POA: Diagnosis present

## 2017-01-28 DIAGNOSIS — F1290 Cannabis use, unspecified, uncomplicated: Secondary | ICD-10-CM | POA: Insufficient documentation

## 2017-01-28 DIAGNOSIS — R109 Unspecified abdominal pain: Secondary | ICD-10-CM | POA: Diagnosis present

## 2017-01-28 DIAGNOSIS — R1013 Epigastric pain: Secondary | ICD-10-CM

## 2017-01-28 DIAGNOSIS — L409 Psoriasis, unspecified: Secondary | ICD-10-CM | POA: Diagnosis present

## 2017-01-28 DIAGNOSIS — F419 Anxiety disorder, unspecified: Secondary | ICD-10-CM | POA: Diagnosis not present

## 2017-01-28 DIAGNOSIS — R9431 Abnormal electrocardiogram [ECG] [EKG]: Secondary | ICD-10-CM | POA: Diagnosis present

## 2017-01-28 HISTORY — DX: Personal history of other diseases of the circulatory system: Z86.79

## 2017-01-28 LAB — URINALYSIS, ROUTINE W REFLEX MICROSCOPIC
BILIRUBIN URINE: NEGATIVE
Bacteria, UA: NONE SEEN
Bilirubin Urine: NEGATIVE
GLUCOSE, UA: 100 mg/dL — AB
Glucose, UA: 50 mg/dL — AB
Ketones, ur: 5 mg/dL — AB
LEUKOCYTES UA: NEGATIVE
LEUKOCYTES UA: NEGATIVE
NITRITE: NEGATIVE
Nitrite: NEGATIVE
PH: 6.5 (ref 5.0–8.0)
PROTEIN: NEGATIVE mg/dL
Protein, ur: 100 mg/dL — AB
SPECIFIC GRAVITY, URINE: 1.025 (ref 1.005–1.030)
Specific Gravity, Urine: 1.015 (ref 1.005–1.030)
pH: 5 (ref 5.0–8.0)

## 2017-01-28 LAB — RAPID URINE DRUG SCREEN, HOSP PERFORMED
AMPHETAMINES: NOT DETECTED
BARBITURATES: NOT DETECTED
BENZODIAZEPINES: NOT DETECTED
Cocaine: NOT DETECTED
OPIATES: POSITIVE — AB
Tetrahydrocannabinol: POSITIVE — AB

## 2017-01-28 LAB — COMPREHENSIVE METABOLIC PANEL
ALBUMIN: 3.9 g/dL (ref 3.5–5.0)
ALT: 12 U/L — AB (ref 14–54)
ALT: 8 U/L — ABNORMAL LOW (ref 14–54)
ANION GAP: 10 (ref 5–15)
AST: 14 U/L — ABNORMAL LOW (ref 15–41)
AST: 19 U/L (ref 15–41)
Albumin: 4.3 g/dL (ref 3.5–5.0)
Alkaline Phosphatase: 63 U/L (ref 38–126)
Alkaline Phosphatase: 69 U/L (ref 38–126)
Anion gap: 12 (ref 5–15)
BILIRUBIN TOTAL: 0.4 mg/dL (ref 0.3–1.2)
BUN: 10 mg/dL (ref 6–20)
BUN: 12 mg/dL (ref 6–20)
CHLORIDE: 107 mmol/L (ref 101–111)
CHLORIDE: 108 mmol/L (ref 101–111)
CO2: 19 mmol/L — ABNORMAL LOW (ref 22–32)
CO2: 20 mmol/L — AB (ref 22–32)
CREATININE: 0.75 mg/dL (ref 0.44–1.00)
Calcium: 9.2 mg/dL (ref 8.9–10.3)
Calcium: 9.4 mg/dL (ref 8.9–10.3)
Creatinine, Ser: 0.86 mg/dL (ref 0.44–1.00)
GFR calc Af Amer: >60 mL/min (ref >60)
GFR calc Af Amer: >60 mL/min (ref >60)
GFR calc non Af Amer: >60 mL/min (ref >60)
GLUCOSE: 178 mg/dL — AB (ref 65–99)
Glucose, Bld: 185 mg/dL — ABNORMAL HIGH (ref 65–99)
POTASSIUM: 3.2 mmol/L — AB (ref 3.5–5.1)
Potassium: 3.8 mmol/L (ref 3.5–5.1)
SODIUM: 139 mmol/L (ref 135–145)
Sodium: 137 mmol/L (ref 135–145)
TOTAL PROTEIN: 7.6 g/dL (ref 6.5–8.1)
Total Bilirubin: 0.5 mg/dL (ref 0.3–1.2)
Total Protein: 8.3 g/dL — ABNORMAL HIGH (ref 6.5–8.1)

## 2017-01-28 LAB — GLUCOSE, CAPILLARY: GLUCOSE-CAPILLARY: 126 mg/dL — AB (ref 65–99)

## 2017-01-28 LAB — CBC WITH DIFFERENTIAL/PLATELET
BASOS ABS: 0.1 10*3/uL (ref 0.0–0.1)
BASOS PCT: 1 %
Basophils Absolute: 0.1 10*3/uL (ref 0.0–0.1)
Basophils Relative: 0 %
EOS PCT: 3 %
Eosinophils Absolute: 0 10*3/uL (ref 0.0–0.7)
Eosinophils Absolute: 0.5 10*3/uL (ref 0.0–0.7)
Eosinophils Relative: 0 %
HEMATOCRIT: 41.6 % (ref 36.0–46.0)
HEMATOCRIT: 43 % (ref 36.0–46.0)
Hemoglobin: 14 g/dL (ref 12.0–15.0)
Hemoglobin: 14.2 g/dL (ref 12.0–15.0)
LYMPHS PCT: 10 %
Lymphocytes Relative: 30 %
Lymphs Abs: 1.6 10*3/uL (ref 0.7–4.0)
Lymphs Abs: 6 10*3/uL — ABNORMAL HIGH (ref 0.7–4.0)
MCH: 30.6 pg (ref 26.0–34.0)
MCH: 31.5 pg (ref 26.0–34.0)
MCHC: 33 g/dL (ref 30.0–36.0)
MCHC: 33.7 g/dL (ref 30.0–36.0)
MCV: 92.7 fL (ref 78.0–100.0)
MCV: 93.5 fL (ref 78.0–100.0)
MONO ABS: 0.4 10*3/uL (ref 0.1–1.0)
MONO ABS: 1 10*3/uL (ref 0.1–1.0)
MONOS PCT: 2 %
MONOS PCT: 5 %
NEUTROS ABS: 14.3 10*3/uL — AB (ref 1.7–7.7)
Neutro Abs: 12.3 10*3/uL — ABNORMAL HIGH (ref 1.7–7.7)
Neutrophils Relative %: 62 %
Neutrophils Relative %: 88 %
PLATELETS: 468 10*3/uL — AB (ref 150–400)
Platelets: 409 10*3/uL — ABNORMAL HIGH (ref 150–400)
RBC: 4.45 MIL/uL (ref 3.87–5.11)
RBC: 4.64 MIL/uL (ref 3.87–5.11)
RDW: 13.4 % (ref 11.5–15.5)
RDW: 13.5 % (ref 11.5–15.5)
WBC: 16.3 10*3/uL — ABNORMAL HIGH (ref 4.0–10.5)
WBC: 20 10*3/uL — ABNORMAL HIGH (ref 4.0–10.5)

## 2017-01-28 LAB — URINALYSIS, MICROSCOPIC (REFLEX)

## 2017-01-28 LAB — ETHANOL: Alcohol, Ethyl (B): <5 mg/dL (ref <5)

## 2017-01-28 LAB — PREGNANCY, URINE: PREG TEST UR: NEGATIVE

## 2017-01-28 LAB — TROPONIN I: Troponin I: <0.03 ng/mL (ref <0.03)

## 2017-01-28 LAB — LIPASE, BLOOD
LIPASE: 40 U/L (ref 11–51)
Lipase: 25 U/L (ref 11–51)

## 2017-01-28 MED ORDER — HYDROMORPHONE HCL 1 MG/ML IJ SOLN
1.0000 mg | Freq: Once | INTRAMUSCULAR | Status: AC
  Administered 2017-01-28: 1 mg via INTRAVENOUS
  Filled 2017-01-28: qty 1

## 2017-01-28 MED ORDER — LORAZEPAM 1 MG PO TABS: 1.0000 mg | ORAL_TABLET | ORAL | Status: DC | PRN

## 2017-01-28 MED ORDER — LORAZEPAM 2 MG/ML IJ SOLN: 1.0000 mg | INTRAMUSCULAR | Status: DC | PRN

## 2017-01-28 MED ORDER — METRONIDAZOLE IN NACL 5-0.79 MG/ML-% IV SOLN: 500.0000 mg | Freq: Once | INTRAVENOUS | Status: DC

## 2017-01-28 MED ORDER — ACETAMINOPHEN 325 MG PO TABS: 650.0000 mg | ORAL_TABLET | Freq: Four times a day (QID) | ORAL | Status: DC | PRN

## 2017-01-28 MED ORDER — LORAZEPAM 2 MG/ML IJ SOLN
2.0000 mg | Freq: Once | INTRAMUSCULAR | Status: AC
  Administered 2017-01-28: 2 mg via INTRAVENOUS
  Filled 2017-01-28: qty 1

## 2017-01-28 MED ORDER — DICYCLOMINE HCL 10 MG/ML IM SOLN
20.0000 mg | Freq: Once | INTRAMUSCULAR | Status: AC
  Administered 2017-01-28: 20 mg via INTRAMUSCULAR
  Filled 2017-01-28: qty 2

## 2017-01-28 MED ORDER — HEPARIN SOD (PORK) LOCK FLUSH 100 UNIT/ML IV SOLN
INTRAVENOUS | Status: AC
  Filled 2017-01-28: qty 5

## 2017-01-28 MED ORDER — LACTATED RINGERS IV SOLN
INTRAVENOUS | Status: DC
  Administered 2017-01-28: 19:00:00 via INTRAVENOUS

## 2017-01-28 MED ORDER — ONDANSETRON HCL 4 MG PO TABS: 4.0000 mg | ORAL_TABLET | Freq: Four times a day (QID) | ORAL | Status: DC | PRN

## 2017-01-28 MED ORDER — SODIUM CHLORIDE 0.9 % IV BOLUS (SEPSIS)
1000.0000 mL | Freq: Once | INTRAVENOUS | Status: AC
Start: 2017-01-28 — End: 2017-01-28
  Administered 2017-01-28: 1000 mL via INTRAVENOUS

## 2017-01-28 MED ORDER — IOPAMIDOL (ISOVUE-300) INJECTION 61%
INTRAVENOUS | Status: AC
  Filled 2017-01-28: qty 30

## 2017-01-28 MED ORDER — CIPROFLOXACIN IN D5W 400 MG/200ML IV SOLN
400.0000 mg | Freq: Once | INTRAVENOUS | Status: AC
  Administered 2017-01-28: 400 mg via INTRAVENOUS
  Filled 2017-01-28: qty 200

## 2017-01-28 MED ORDER — CIPROFLOXACIN IN D5W 400 MG/200ML IV SOLN
400.0000 mg | Freq: Two times a day (BID) | INTRAVENOUS | Status: DC
  Administered 2017-01-29: 400 mg via INTRAVENOUS
  Filled 2017-01-28: qty 200

## 2017-01-28 MED ORDER — INSULIN ASPART 100 UNIT/ML ~~LOC~~ SOLN: 0.0000 [IU] | Freq: Three times a day (TID) | SUBCUTANEOUS | Status: DC

## 2017-01-28 MED ORDER — PANTOPRAZOLE SODIUM 40 MG IV SOLR
40.0000 mg | Freq: Two times a day (BID) | INTRAVENOUS | Status: DC
  Administered 2017-01-28 – 2017-01-29 (×2): 40 mg via INTRAVENOUS
  Filled 2017-01-28 (×2): qty 40

## 2017-01-28 MED ORDER — ACETAMINOPHEN 650 MG RE SUPP: 650.0000 mg | Freq: Four times a day (QID) | RECTAL | Status: DC | PRN

## 2017-01-28 MED ORDER — IOPAMIDOL (ISOVUE-300) INJECTION 61%
100.0000 mL | Freq: Once | INTRAVENOUS | Status: AC | PRN
  Administered 2017-01-28: 100 mL via INTRAVENOUS

## 2017-01-28 MED ORDER — ONDANSETRON HCL 4 MG/2ML IJ SOLN
4.0000 mg | Freq: Four times a day (QID) | INTRAMUSCULAR | Status: DC | PRN
  Administered 2017-01-28 – 2017-01-29 (×2): 4 mg via INTRAVENOUS
  Filled 2017-01-28 (×2): qty 2

## 2017-01-28 MED ORDER — MORPHINE SULFATE (PF) 2 MG/ML IV SOLN
2.0000 mg | INTRAVENOUS | Status: DC | PRN
  Administered 2017-01-28 – 2017-01-29 (×5): 2 mg via INTRAVENOUS
  Filled 2017-01-28 (×5): qty 1

## 2017-01-28 MED ORDER — METRONIDAZOLE IN NACL 5-0.79 MG/ML-% IV SOLN
500.0000 mg | Freq: Three times a day (TID) | INTRAVENOUS | Status: DC
  Administered 2017-01-28 – 2017-01-29 (×2): 500 mg via INTRAVENOUS
  Filled 2017-01-28 (×2): qty 100

## 2017-01-28 NOTE — ED Notes (Signed)
Pt screaming again.  Asked pt to please stop screaming and to use her call light.  Pt states "I can't because I am hallucinating from so much pain."  Pt medicated as ordered and screaming has stopped.

## 2017-01-28 NOTE — ED Notes (Signed)
Pt screaming from room.  Went in to see what was needed.  Pt states she needs something for pain "right now".  Pt climbing out of bed.  Informed the doctor was aware and was ordering something.  Pt stopped screaming and got back into bed.

## 2017-01-28 NOTE — Progress Notes (Signed)
Late entry:  Patient screaming loudly since arrival on unit.  Patient reports, "It's primal.  I have to do it."  Patient asked to stop screaming, pain medication given, call bell within reach, bed alarm on.

## 2017-01-28 NOTE — Discharge Instructions (Signed)
Keep yourself hydrated and follow up with a primary doctor. Return to the ED if you develop new or worsening symptoms.

## 2017-01-28 NOTE — ED Notes (Signed)
Contacted pt brother, Elijah Birkom, and notified him of pt admission and room number.

## 2017-01-28 NOTE — H&P (Addendum)
History and Physical    Lauren Atkinson ZOX:[id number] DOB: 1961-05-09 DOA: 01/28/2017  PCP: Gwenyth Bender, MD Consultants:  None Patient coming from: recently moved here from SNF in [place] - "I was on Hospice and they declared me a miracle and I had nowhere else to go", moved here about a month ago; home - lives with brother; [place]: brother, Solicitor Complaint: abdominal pain  HPI: Lauren Atkinson is a 56 y.o. 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  Acute flare-ups seem to be happening frequently.  They happen suddenly with escalation of pain, pouring sweat, within an hour she is screaming in pain and hallucinating.    Sometimes it comes in waves and sometimes it is constant.  +n/v.  No fevers.  This is a pretty typical flare-up but is a bad one because "they won't knock the pain out, I don't know why - it's usually a high dose of Dilaudid with some Ativan and Benadryl cocktail."  The patient reported that she was unable to provide additional history due to her severe distress.  She has limited visits in our system and there are no Care Everywhere reports in Epic.  Her visits include: -02/12/15 - ER visit where patient reported visiting from [place] and was requesting refills on Fentanyl and Oxycodone for prevention of attacks.  No narcotics given and she was referred to PCP. -02/16/15 - Very similar visit; report that "She has a port on the right side of her chest for Phenergan due to porphyria."  She also stated "that if she does not get morphine or dilaudid she will go out in the hall and  scream.  She is crying and screaming in the room."  Also, "I will say I am suicidal if you don't give me pain medication."  Concer for narcotic seeking behavior with normal labs and reassuring exam. -4/3-5/16 - Admitted for acute intermittent porphyria with labs ordered that may not be consistent with this diagnosis (see A/P).  She mat criteria for sepsis on admission and was briefly treated with Cipro/Flagyl but this was discontinued due to no apparent source of infection or fever.  She was initially placed on a Dilaudid PCA but this worsened her hypotension and so it was stopped. -01/14/17 - Pain improved with Dilaudid IV x 2 and discharged with GI follow-up -01/27/17 - Evaluation with chronic leukocytosis and hyperglycemia.  Refused to take PO contrast but CT indicated mild diffuse colonic wall thickening, possibly colitis.  UDS positive for opiates and marijuana.  Discharged without medications.    ED Course:  2 ER visits in a row, persistent pain, leukocytosis - concern for colitis vs. AIP  Review of Systems: As per HPI; otherwise 10 point review of systems reviewed and negative.   Ambulatory Status:  ambulates without difficulty  Past Medical History:  Diagnosis Date  . Acute porphyria (HCC)   . Anxiety   . Chronic abdominal pain   . Depression   . Dysrhythmia    a fib  . H/O myocardial ischemia    with cardiomyopathy and prolonged QT, per patient report  . Seizures (HCC)   . Stroke [facility])    "  2, at least"    Past Surgical History:  Procedure Laterality Date  . abdominal patch      Social History   Social History  . Marital status: Single    Spouse name: N/A  . Number of children: N/A  . Years of education: N/A   Occupational History  . Not on file.   Social History Main Topics  . Smoking status: Current Every Day Smoker    Packs/day: 1.00    Types: Cigarettes  . Smokeless tobacco: Never Used  . Alcohol use No  . Drug use: Yes    Types: Marijuana  . Sexual  activity: Not on file   Other Topics Concern  . Not on file   Social History Narrative  . No narrative on file    Allergies  Allergen Reactions  . Heparin   . Penicillins   . Toradol [Ketorolac Tromethamine]     History reviewed. No pertinent family history.  Prior to Admission medications   Medication Sig Start Date End Date Taking? Authorizing Provider  oxyCODONE-acetaminophen (PERCOCET/ROXICET) 5-325 MG tablet Take 1 tablet by mouth every 6 (six) hours as needed. 01/14/17   Bethann Berkshire, MD  prochlorperazine (COMPAZINE) 10 MG tablet Take 1 tablet (10 mg total) by mouth 2 (two) times daily as needed for nausea or vomiting. 01/14/17   Bethann Berkshire, MD    Physical Exam: Vitals:   01/28/17 1530 01/28/17 1630 01/28/17 1655 01/28/17 1730  BP: 121/72 (!) 184/62 (!) 176/88 138/85  Pulse:   85 79  Resp:   20 16  Temp:   98.4 F (36.9 C)   TempSrc:   Oral   SpO2:   95% 97%  Weight:      Height:         General: Disheveled, writhing all over the bed, intermittently moaning, retching without production at times, very dramatic Eyes:  PERRL, EOMI, normal lids, iris ENT:  grossly normal hearing, lips & tongue, mmm Neck:  no LAD, masses or thyromegaly Cardiovascular:  RRR, no m/r/g. No LE edema.  Respiratory:  CTA bilaterally, no w/r/r. Normal respiratory effort. Abdomen:  soft, mildly tender in miedepigastric region - patient actually placed my hand directly on the area where it hurt, nd, NABS Skin: psoriasis on left elbow, limited skin exam otherwise unremarkable Musculoskeletal:  grossly normal tone BUE/BLE, good ROM, no bony abnormality Psychiatric:  Very dramatic, lying contorted when I came in but flung her hair and her body throughout the encounter, speech fluent and appropriate, AOx3 Neurologic:  CN 2-12 grossly intact, moves all extremities in coordinated fashion, sensation intact  Labs on Admission: I have personally reviewed following labs and imaging  studies  CBC:  Recent Labs Lab 01/27/17 2354 01/28/17 1317  WBC 20.0* 16.3*  NEUTROABS 12.3* 14.3*  HGB 14.0 14.2  HCT 41.6 43.0  MCV 93.5 92.7  PLT 468* 409*   Basic Metabolic Panel:  Recent Labs Lab 01/27/17 2354 01/28/17 1317  NA 137 139  K 3.2* 3.8  CL 108 107  CO2 19* 20*  GLUCOSE 185* 178*  BUN 12 10  CREATININE 0.86 0.75  CALCIUM 9.2 9.4   GFR: Estimated Creatinine Clearance: 64.1 mL/min (by C-G formula based on SCr of 0.75 mg/dL). Liver Function Tests:  Recent Labs Lab 01/27/17 2354 01/28/17 1317  AST 14* 19  ALT 8* 12*  ALKPHOS 63 69  BILITOT 0.4 0.5  PROT 7.6 8.3*  ALBUMIN 3.9 4.3    Recent Labs Lab 01/27/17 2354 01/28/17  1317  LIPASE 40 25   No results for input(s): AMMONIA in the last 168 hours. Coagulation Profile: No results for input(s): INR, PROTIME in the last 168 hours. Cardiac Enzymes:  Recent Labs Lab 01/27/17 2354  TROPONINI <0.03   BNP (last 3 results) No results for input(s): PROBNP in the last 8760 hours. HbA1C: No results for input(s): HGBA1C in the last 72 hours. CBG: No results for input(s): GLUCAP in the last 168 hours. Lipid Profile: No results for input(s): CHOL, HDL, LDLCALC, TRIG, CHOLHDL, LDLDIRECT in the last 72 hours. Thyroid Function Tests: No results for input(s): TSH, T4TOTAL, FREET4, T3FREE, THYROIDAB in the last 72 hours. Anemia Panel: No results for input(s): VITAMINB12, FOLATE, FERRITIN, TIBC, IRON, RETICCTPCT in the last 72 hours. Urine analysis:    Component Value Date/Time   COLORURINE YELLOW 01/28/2017 1312   APPEARANCEUR CLEAR 01/28/2017 1312   LABSPEC 1.025 01/28/2017 1312   PHURINE 6.5 01/28/2017 1312   GLUCOSEU 100 (A) 01/28/2017 1312   HGBUR MODERATE (A) 01/28/2017 1312   BILIRUBINUR NEGATIVE 01/28/2017 1312   KETONESUR TRACE (A) 01/28/2017 1312   PROTEINUR 100 (A) 01/28/2017 1312   UROBILINOGEN 1.0 02/17/2015 2038   NITRITE NEGATIVE 01/28/2017 1312   LEUKOCYTESUR NEGATIVE  01/28/2017 1312    Creatinine Clearance: Estimated Creatinine Clearance: 64.1 mL/min (by C-G formula based on SCr of 0.75 mg/dL).  Sepsis Labs: @LABRCNTIP (procalcitonin:4,lacticidven:4) )No results found for this or any previous visit (from the past 240 hour(s)).   Radiological Exams on Admission: Ct Abdomen Pelvis W Contrast  Result Date: 01/28/2017 CLINICAL DATA:  Diffuse abdominal pain, leukocytosis.  Porphyria. EXAM: CT ABDOMEN AND PELVIS WITH CONTRAST TECHNIQUE: Multidetector CT imaging of the abdomen and pelvis was performed using the standard protocol following bolus administration of intravenous contrast. CONTRAST:  ISOVUE-300 IOPAMIDOL (ISOVUE-300) INJECTION 61% COMPARISON:  Acute abdominal series January 28, 2017 at 0120 hours and CT abdomen and pelvis January 14, 2017 FINDINGS: LOWER CHEST: Lung bases are clear. Included heart size is normal. No pericardial effusion. HEPATOBILIARY: A few scattered subcentimeter hypodensities in liver most compatible with cysts. Normal gallbladder. PANCREAS: Normal. SPLEEN: Normal. ADRENALS/URINARY TRACT: Kidneys are orthotopic, demonstrating symmetric enhancement. No nephrolithiasis, hydronephrosis or solid renal masses. Too small to characterize hypodensity RIGHT interpolar kidney. The unopacified ureters are normal in course and caliber. Delayed imaging through the kidneys demonstrates symmetric prompt contrast excretion within the proximal urinary collecting system. Urinary bladder is well distended and unremarkable. Normal adrenal glands. STOMACH/BOWEL: Small hiatal hernia. Diffuse mild colonic wall thickening though colon is decompressed, limited assessment without oral contrast. The stomach, small bowel are normal in course and caliber without inflammatory changes. Normal appendix. VASCULAR/LYMPHATIC: Aortoiliac vessels are normal in course and caliber, severe calcific atherosclerosis in mild stenosis infrarenal aorta. No lymphadenopathy by CT size  criteria. REPRODUCTIVE: Normal. OTHER: No intraperitoneal free fluid or free air. MUSCULOSKELETAL: Nonacute. Status post anterior abdominal wall herniorrhaphy with mesh. Moderate bilateral hip osteoarthrosis with superimposed curvilinear sclerosis femoral heads. Old L5-S1 disc extrusion with mineralization. IMPRESSION: Mild suspected colitis, assessment limited by lack of oral contrast. 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 Severe atherosclerosis. Electronically Signed   By: Awilda Metro M.D.   On: 01/28/2017 03:39   Dg Abdomen Acute W/chest  Result Date: 01/28/2017 CLINICAL DATA:  Severe acute on chronic abdominal pain, vomiting and diarrhea. History of porphyria. EXAM: DG ABDOMEN ACUTE W/ 1V CHEST COMPARISON:  CT abdomen and pelvis January 14, 2017 inch chest radiograph February 18, 2015 FINDINGS: Cardiac silhouette is  upper limits of normal in size. Mediastinal silhouette is nonsuspicious. Increased lung volumes with mild chronic interstitial changes, unchanged. No pleural effusion or focal consolidation. Single lumen RIGHT chest Port-A-Cath with distal tip at cavoatrial junction. Similar fullness of pulmonary hilar consistent with vascular shadows. Osteopenia. Bowel gas pattern is nondilated and nonobstructive. No intra-abdominal mass effect, pathologic calcifications or free air. Soft tissue planes and included osseous structures are non-suspicious. Mild vascular calcifications. Status post abdominal herniorrhaphy. IMPRESSION: Borderline cardiomegaly and probable COPD. Nonspecific bowel gas pattern. Electronically Signed   By: Awilda Metro M.D.   On: 01/28/2017 02:31    EKG: Independently reviewed.  NSR with rate 98; Sinus rhythm, Ventricular bigeminy, Biatrial enlargement, Left posterior fascicular block, nonspecific ST changes with no evidence of acute ischemia  Assessment/Plan Principal Problem:   Abdominal pain Active Problems:   Psoriasis   Hyperglycemia   Abnormal  EKG   Abdominal pain -Patient presenting with c/o "acute intermittent porphyria" -It is certainly possible that she has this condition, but this is far from certain based on:  -Urine is clear and yellow, which is not consistent with porphyria  -and testing in 3/16 was not conclusive for porphyria.  There were several mildly abnormal tests on the panel, but the interpretation was that "These elevations are likely of no clinical significance." -Further suggestion was "In order to help with the differential diagnosis, please consider fecal fractionated porphyrins analysis."  I did not see this as an option in Epic, but was able to order serum fractionated porphyrins. -She did have a hematology consult recommended on her 3/16 admission, but this was not done.  Will order. -She has been referred to GI more recently and this is pending.  Could consider inpatient evaluation if needed. -She reports having been admitted to SNF for Hospice with miraculous recovery, but then is continuing to have severe bouts of abdominal pain.. -She has a port-a-cath that she reported was for Phenergan when she has bouts of AIP. -She exhibited drug-seeking behaviors on multiple episodes in the ER and appeared to be also seeking a very specific "cocktail" in the ER today. -If this was actually an AIP exacerbation, then the treatment with actually be Hemin IV for 4 days - not high-dose narcotics in cocktail with other sedating/mood-altering agents. -For now, will treat conservatively with PO/IM Ativan and low-dose prn morphine. -WBC 16.3 (the last time she had a test without leukocytosis noted was in 4/16) -Because of the possible colitis seen on CT 2 days ago, will continue Cipro/Flagyl for now. -She does not appear to have active vomiting, just very dramatic retching, so will give clear liquids to see if she can tolerate these and if so, will advance diet as tolerated. -Zofran prn n/v - she does not have QT prolongation at  this time and it is preferred to avoid further sedating medications, if possible. -UDS shows opiates and marijuana.  It is possible that some of her symptoms are from cannabinoid hyperemesis syndrome and/or opiate withdrawal. -Will also give IV PPI, as midepigastric abdominal pain and n/v could stem from PUD/gastritis.  Hypergylcemia -Glucose 178 -Last A1c was 5.4 in 4/16 -She denies h/o DM -She reports unintentional weight loss and acute on chronic abdominal pain with n/v; diabetes would also be a reasonable consideration. -Will check A1c and cover with SSI.  Psoriasis -Noted on her left elbow -When asked about it, she said "that is the least of my problems". -Suggest outpatient f/u.  Abnormal EKG -Patient reports h/o MI, CVA,  cardiomyopathy. -Her EKG is clearly abnormal. -It may be helpful to have a better understanding of her overall condition and so will order Echo. -Will repeat EKG in AM.    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 The aforementioned taken together are felt to place the patient at high risk for further clinical deterioration. However it is anticipated that the patient may be medically stable for discharge from the hospital within 24 to 48 hours.    Jonah BlueJennifer Stefan Karen MD [facility]  If 7PM-7AM, please contact night-coverage [email or web address] Password [email or web address]  01/28/2017, 5:57 PM

## 2017-01-28 NOTE — ED Provider Notes (Signed)
[facility]Y DEPT Provider Note   CSN: [id number] Arrival date & time: 01/28/17  1251     History   Chief Complaint Chief Complaint  Patient presents with  . Abdominal Pain    HPI Lauren Atkinson is a 56 y.o. female.  HPI  Patient presents with for the second time in 2 days with concern of abdominal pain. The pain is epigastric, sharp, severe, nonradiating, excruciating. Patient has had minimal, transient relief with marijuana. Patient had some relief yesterday after being seen here, receiving narcotics. She notes that soon after arriving home her pain came back. No other new concerns, including fever, vomiting. No other abdominal pain. Patient states that she has a history of chronic abdominal pain, porphyria. She states that she recently moved to this area, has no hematologist, no physician.   Past Medical History:  Diagnosis Date  . Acute porphyria (HCC)   . Anxiety   . Chronic abdominal pain   . Depression   . Dysrhythmia    a fib  . Seizures (HCC)   . Stroke [facility](HCC)     Patient Active Problem List   Diagnosis Date Noted  . Generalized abdominal pain   . Intractable nausea and vomiting 02/18/2015  . 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 Chronic abdominal pain 02/18/2015  . Anxiety 02/18/2015  . Depression 02/18/2015  . Seizure (HCC) 02/18/2015  . CVA (cerebral infarction) 02/18/2015  . Atrial fibrillation (HCC) 02/18/2015  . Psoriasis 02/18/2015  . Protein-calorie malnutrition, severe (HCC) 02/18/2015  . Acute epigastric pain   . Porphyria (HCC)   . Prolonged QT interval   . Blood poisoning (HCC)   . Leukocytosis     Past Surgical History:  Procedure Laterality Date  . abdominal patch      OB History    No data available       Home Medications    Prior to Admission medications   Medication Sig Start Date End Date Taking? Authorizing Provider  oxyCODONE-acetaminophen (PERCOCET/ROXICET) 5-325 MG tablet Take 1 tablet by  mouth every 6 (six) hours as needed. 01/14/17   Bethann BerkshireJoseph Zammit, MD  prochlorperazine (COMPAZINE) 10 MG tablet Take 1 tablet (10 mg total) by mouth 2 (two) times daily as needed for nausea or vomiting. 01/14/17   Bethann BerkshireJoseph Zammit, MD    Family History No family history on file.  Social History Social History  Substance Use Topics  . Smoking status: Current Every Day Smoker    Packs/day: 1.00    Types: Cigarettes  . Smokeless tobacco: Never Used  . Alcohol use No     Allergies   Heparin; Penicillins; and Toradol [ketorolac tromethamine]   Review of Systems Review of Systems  Constitutional:       Per HPI, otherwise negative  HENT:       Per HPI, otherwise negative  Respiratory:       Per HPI, otherwise negative  Cardiovascular:       Per HPI, otherwise negative  Gastrointestinal: Positive for abdominal pain and nausea. Negative for vomiting.  Endocrine:       Negative aside from HPI  Genitourinary:       Neg aside from HPI   Musculoskeletal:       Per HPI, otherwise negative  Skin: Negative.   Allergic/Immunologic: Negative for immunocompromised state.  Neurological: Negative for syncope.  Hematological:       Per history of present illness  Psychiatric/Behavioral: The patient is nervous/anxious.      Physical Exam Updated  Vital Signs BP (!) 173/95   Pulse 70   Temp 97.8 F (36.6 C) (Oral)   Resp 16   Ht 5\' 6"  (1.676 m)   Wt 110 lb (49.9 kg)   SpO2 100%   BMI 17.75 kg/m   Physical Exam  Constitutional: She is oriented to person, place, and time. She appears well-developed and well-nourished. She appears distressed.  Distress from pain, rocking on bed. Diaphoretic Will not allow proper exam due to discomfort.   HENT:  Head: Normocephalic and atraumatic.  Right Ear: Hearing normal.  Left Ear: Hearing normal.  Nose: Nose normal.  Mouth/Throat: Oropharynx is clear and moist and mucous membranes are normal.  Eyes: Conjunctivae and EOM are normal. Pupils are  equal, round, and reactive to light.  Neck: Normal range of motion. Neck supple.  Cardiovascular: Regular rhythm, S1 normal and S2 normal.  Exam reveals no gallop and no friction rub.   No murmur heard. Pulmonary/Chest: Effort normal and breath sounds normal. No respiratory distress. She exhibits no tenderness.  Abdominal: Soft. Normal appearance and bowel sounds are normal. There is no hepatosplenomegaly. There is tenderness (epigastric). There is no rebound, no guarding, no tenderness at McBurney's point and negative Murphy's sign. No hernia.  Musculoskeletal: Normal range of motion.  Neurological: She is alert and oriented to person, place, and time. She has normal strength. No cranial nerve deficit or sensory deficit. Coordination normal. GCS eye subscore is 4. GCS verbal subscore is 5. GCS motor subscore is 6.  Skin: Skin is warm, dry and intact. No rash noted. No cyanosis.  Psychiatric: She has a normal mood and affect. Her speech is normal and behavior is normal. Thought content normal.  Nursing note and vitals reviewed.    ED Treatments / Results  Labs (all labs ordered are listed, but only abnormal results are displayed) Labs Reviewed  COMPREHENSIVE METABOLIC PANEL  ETHANOL  LIPASE, BLOOD  CBC WITH DIFFERENTIAL/PLATELET  URINALYSIS, ROUTINE W REFLEX MICROSCOPIC    Radiology Ct Abdomen Pelvis W Contrast  Result Date: 01/28/2017 CLINICAL DATA:  Diffuse abdominal pain, leukocytosis.  Porphyria. EXAM: CT ABDOMEN AND PELVIS WITH CONTRAST TECHNIQUE: Multidetector CT imaging of the abdomen and pelvis was performed using the standard protocol following bolus administration of intravenous contrast. CONTRAST:  ISOVUE-300 IOPAMIDOL (ISOVUE-300) INJECTION 61% COMPARISON:  Acute abdominal series January 28, 2017 at 0120 hours and CT abdomen and pelvis January 14, 2017 FINDINGS: LOWER CHEST: Lung bases are clear. Included heart size is normal. No pericardial effusion. HEPATOBILIARY: A few  scattered subcentimeter hypodensities in liver most compatible with cysts. Normal gallbladder. PANCREAS: Normal. SPLEEN: Normal. ADRENALS/URINARY TRACT: Kidneys are orthotopic, demonstrating symmetric enhancement. No nephrolithiasis, hydronephrosis or solid renal masses. Too small to characterize hypodensity RIGHT interpolar kidney. The unopacified ureters are normal in course and caliber. Delayed imaging through the kidneys demonstrates symmetric prompt contrast excretion within the proximal urinary collecting system. Urinary bladder is well distended and unremarkable. Normal adrenal glands. STOMACH/BOWEL: Small hiatal hernia. Diffuse mild colonic wall thickening though colon is decompressed, limited assessment without oral contrast. The stomach, small bowel are normal in course and caliber without inflammatory changes. Normal appendix. VASCULAR/LYMPHATIC: Aortoiliac vessels are normal in course and caliber, severe calcific atherosclerosis in mild stenosis infrarenal aorta. No lymphadenopathy by CT size criteria. REPRODUCTIVE: Normal. OTHER: No intraperitoneal free fluid or free air. MUSCULOSKELETAL: Nonacute. Status post anterior abdominal wall herniorrhaphy with mesh. Moderate bilateral hip osteoarthrosis with superimposed curvilinear sclerosis femoral heads. Old L5-S1 disc extrusion with mineralization. IMPRESSION:  Mild suspected colitis, assessment limited by lack of oral contrast. 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 Severe atherosclerosis. Electronically Signed   By: Awilda Metro M.D.   On: 01/28/2017 03:39   Dg Abdomen Acute W/chest  Result Date: 01/28/2017 CLINICAL DATA:  Severe acute on chronic abdominal pain, vomiting and diarrhea. History of porphyria. EXAM: DG ABDOMEN ACUTE W/ 1V CHEST COMPARISON:  CT abdomen and pelvis January 14, 2017 inch chest radiograph February 18, 2015 FINDINGS: Cardiac silhouette is upper limits of normal in size. Mediastinal silhouette is nonsuspicious.  Increased lung volumes with mild chronic interstitial changes, unchanged. No pleural effusion or focal consolidation. Single lumen RIGHT chest Port-A-Cath with distal tip at cavoatrial junction. Similar fullness of pulmonary hilar consistent with vascular shadows. Osteopenia. Bowel gas pattern is nondilated and nonobstructive. No intra-abdominal mass effect, pathologic calcifications or free air. Soft tissue planes and included osseous structures are non-suspicious. Mild vascular calcifications. Status post abdominal herniorrhaphy. IMPRESSION: Borderline cardiomegaly and probable COPD. Nonspecific bowel gas pattern. Electronically Signed   By: Awilda Metro M.D.   On: 01/28/2017 02:31   Yesterday's radiographic studies reviewed, discussed with the patient. Procedures Procedures (including critical care time)  Medications Ordered in ED Medications  sodium chloride 0.9 % bolus 1,000 mL (not administered)  dicyclomine (BENTYL) injection 20 mg (not administered)  LORazepam (ATIVAN) injection 2 mg (not administered)   After the initial evaluation I reviewed patient's abdominal pain, indication for intervention with Bentyl, Ativan, fluids. Patient deferred offer of Haldol, other nonnarcotic pain medication.  3:51 PM Patient continues to c/o pain.  Porphyrin Studies from 02/18/2015  Porphyrins, fractionated, random urine (Order [id number])  Porphyrins, fractionated, random urine  Order: [id number]  Status:  Final result   Visible to patient:  Yes (MyChart) Next appt:  None    Ref Range & Units 51yr ago   Uroporphyrin, random ur 3.1 - 18.2 mcg/g creat 9.3   Uroporphyrin III (PORFRU) <=4.8 mcg/g creat 2.1   Heptacarboxyporphyrin, random ur <=2.9 mcg/g creat 3.8    Hexacarboxyporphyrin, random ur <=5.4 mcg/g creat SEE NOTE   Comments: Result is below reportable range for this analyte.  Pentacarboxyporphyrin, random ur <=3.5 mcg/g creat 2.0   Coproporphyrin, random ur 5.6 - 28.6 mcg/g creat 26.0    Coproporphyrin III (PORFRU) 4.1 - 76.4 mcg/g creat 94.0    Total Porphyrins, random ur 23.3 - 132.4 mcg/g creat 137.2            Initial Impression / Assessment and Plan / ED Course  I have reviewed the triage vital signs and the nursing notes.  Pertinent labs & imaging results that were available during my care of the patient were reviewed by me and considered in my medical decision making (see chart for details).  Patient presents with concern of ongoing abdominal pain. Patient states that she has a history of porphyria, but no local hematologist. Patient was here yesterday, with similar pain, had a transient improvement with narcotics per The patient is found to have persistent pain, as well as leukocytosis, and with ongoing pain, as well as concern for colitis, versus acute exacerbation of her possible porphyria conversation evaluation and management.  Final Clinical Impressions(s) / ED Diagnoses  Abdominal Pain    Gerhard Munch, MD 01/28/17 1630

## 2017-01-28 NOTE — ED Notes (Signed)
Pt given ice water.

## 2017-01-28 NOTE — ED Triage Notes (Signed)
Pt c/o upper abdominal pain and vomiting that started early this morning. Pt was in ED last night for same and went home and felt ok for a couple hours and then the pain and vomiting returned. Denies diarrhea.

## 2017-01-29 ENCOUNTER — Observation Stay (HOSPITAL_BASED_OUTPATIENT_CLINIC_OR_DEPARTMENT_OTHER): Payer: Medicaid - Out of State

## 2017-01-29 DIAGNOSIS — R9431 Abnormal electrocardiogram [ECG] [EKG]: Secondary | ICD-10-CM

## 2017-01-29 LAB — BASIC METABOLIC PANEL
ANION GAP: 8 (ref 5–15)
BUN: 14 mg/dL (ref 6–20)
CO2: 23 mmol/L (ref 22–32)
Calcium: 9 mg/dL (ref 8.9–10.3)
Chloride: 106 mmol/L (ref 101–111)
Creatinine, Ser: 0.97 mg/dL (ref 0.44–1.00)
GFR calc Af Amer: >60 mL/min (ref >60)
GFR calc non Af Amer: >60 mL/min (ref >60)
GLUCOSE: 103 mg/dL — AB (ref 65–99)
POTASSIUM: 3.5 mmol/L (ref 3.5–5.1)
Sodium: 137 mmol/L (ref 135–145)

## 2017-01-29 LAB — ECHOCARDIOGRAM COMPLETE
CHL CUP MV DEC (S): 257
E decel time: 257 msec
E/e' ratio: 8.49
FS: 33 % (ref 28–44)
Height: 66 in
IV/PV OW: 0.86
LA ID, A-P, ES: 31 mm
LA diam end sys: 31 mm
LA diam index: 2.05 cm/m2
LA vol index: 21.3 mL/m2
LA vol: 32.2 mL
LAVOLA4C: 39.3 mL
LV E/e'average: 8.49
LV TDI E'LATERAL: 7.83
LVEEMED: 8.49
LVELAT: 7.83 cm/s
LVOT SV: 53 mL
LVOT VTI: 18.8 cm
LVOT area: 2.84 cm2
LVOT diameter: 19 mm
LVOT peak grad rest: 2 mmHg
LVOTPV: 72.4 cm/s
MV pk A vel: 66 m/s
MVPKEVEL: 66.5 m/s
PW: 9.62 mm — AB (ref 0.6–1.1)
RV LATERAL S' VELOCITY: 12.3 cm/s
RV sys press: 26 mmHg
Reg peak vel: 238 cm/s
TAPSE: 20.7 mm
TDI e' medial: 6.85
TRMAXVEL: 238 cm/s
Weight: 1760 oz

## 2017-01-29 LAB — CBC
HEMATOCRIT: 39.7 % (ref 36.0–46.0)
HEMOGLOBIN: 13.4 g/dL (ref 12.0–15.0)
MCH: 31.6 pg (ref 26.0–34.0)
MCHC: 33.8 g/dL (ref 30.0–36.0)
MCV: 93.6 fL (ref 78.0–100.0)
Platelets: 316 10*3/uL (ref 150–400)
RBC: 4.24 MIL/uL (ref 3.87–5.11)
RDW: 13.5 % (ref 11.5–15.5)
WBC: 11.8 10*3/uL — ABNORMAL HIGH (ref 4.0–10.5)

## 2017-01-29 LAB — GLUCOSE, CAPILLARY
GLUCOSE-CAPILLARY: 89 mg/dL (ref 65–99)
Glucose-Capillary: 78 mg/dL (ref 65–99)

## 2017-01-29 MED ORDER — HEPARIN SOD (PORK) LOCK FLUSH 100 UNIT/ML IV SOLN
500.0000 [IU] | INTRAVENOUS | Status: AC | PRN
  Administered 2017-01-29: 500 [IU]
  Filled 2017-01-29: qty 5

## 2017-01-29 NOTE — Progress Notes (Signed)
  Echocardiogram 2D Echocardiogram has been performed.  Delcie RochENNINGTON, Mayzie Caughlin 01/29/2017, 9:22 AM

## 2017-01-29 NOTE — Progress Notes (Signed)
Discharge instructions given on medications and follow up visits,patient verbalized understanding .Accompanied by staff to an awaiting vehicle.

## 2017-01-29 NOTE — Care Management Note (Signed)
Case Management Note  Patient Details  Name: Lauren Atkinson MRN: [id number] Date of Birth: 1961/07/27    Expected Discharge Date:  01/29/17               Expected Discharge Plan:  Home/Self Care  In-House Referral:     Discharge planning Services  CM Consult  Post Acute Care Choice:    Choice offered to:     DME Arranged:    DME Agency:     HH Arranged:    HH Agency:     Status of Service:  Completed, signed off  If discussed at [employer] Length of Stay Meetings, dates discussed:    Additional Comments: Patient discharging home today. No PCP, Patient advised that MD wants her to have f/u appt. Patient states "she doesn't care want Dr. Conley RollsLe wants". Patient given list of providers accepting patients. [facility] and plans to apply for [facility].   Audreena Sachdeva, Chrystine OilerSharley Diane, RN 01/29/2017, 11:49 AM

## 2017-01-29 NOTE — Progress Notes (Signed)
Patient say's she does not have a allergy to Heparin, which will be used to flush port-a-cath,Dr Theron AristaPeter Le,notified, and  [facility]

## 2017-01-29 NOTE — Discharge Summary (Addendum)
Physician Discharge Summary  Lauren Atkinson WJX:[id number] DOB: 1961/07/20 DOA: 01/28/2017  PCP: Gwenyth Bender, MD  Admit date: 01/28/2017 Discharge date: 01/29/2017  Admitted From: Home.  Disposition:  Home.   Recommendations for Outpatient Follow-up:  1. Follow up with PCP in 1-2 weeks  Home Health: None.  Equipment/Devices: None.  Discharge Condition: walking around, yelling, cursing, with no abdominal pain.  CODE STATUS: FULL CODE.  Diet recommendation:   Brief/Interim Summary:  Patient was admitted OBS for chronic abdominal pain with drug seeking behavior by Dr Ophelia Charter on January 28, 2017.  As per her H and P:  "  Lauren Atkinson is a 56 y.o. 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  Acute flare-ups seem to be happening frequently.  They happen suddenly with escalation of pain, pouring sweat, within an hour she is screaming in pain and hallucinating.    Sometimes it comes in waves and sometimes it is constant.  +n/v.  No fevers.  This is a pretty typical flare-up but is a bad one because "they won't knock the pain out, I don't know why - it's usually a high dose of Dilaudid with some Ativan and Benadryl cocktail."  The patient reported that she was unable to provide additional history due to her severe distress.  She has limited visits in our system and there are no Care Everywhere reports in Epic.  Her visits include: -02/12/15 - ER visit where patient reported visiting from [place] and was requesting refills on Fentanyl and Oxycodone for prevention of attacks.  No narcotics given and she was  referred to PCP. -02/16/15 - Very similar visit; report that "She has a port on the right side of her chest for Phenergan due to porphyria."  She also stated "that if she does not get morphine or dilaudid she will go out in the hall and scream.  She is crying and screaming in the room."  Also, "I will say I am suicidal if you don't give me pain medication."  Concer for narcotic seeking behavior with normal labs and reassuring exam. -4/3-5/16 - Admitted for acute intermittent porphyria with labs ordered that may not be consistent with this diagnosis (see A/P).  She mat criteria for sepsis on admission and was briefly treated with Cipro/Flagyl but this was discontinued due to no apparent source of infection or fever.  She was initially placed on a Dilaudid PCA but this worsened her hypotension and so it was stopped. -01/14/17 - Pain improved with Dilaudid IV x 2 and discharged with GI follow-up -01/27/17 - Evaluation with chronic leukocytosis and hyperglycemia.  Refused to take PO contrast but CT indicated mild diffuse colonic wall thickening, possibly colitis.  UDS positive for opiates and marijuana.  Discharged without medications.  HOSPITAL COURSE:  Patient was lying quietly and sleeping.  When discussed that narcotic will not be given, and that her medical problem are chronic and needs to follow up with her PCP, she became violently upset, and said to me:  " You are just another idiot doctor"  And  " nobody gives a fuck anyway" .  I offered to call and refer her to a PCP where  she can have her chronic intermittent porphyria followed, but she refused. She said " I don't need you to do shit". Before able to discussed more about her condition, she said " give me the fucking discharge paper now and let me out the here".  When discussing with her about the new [place] narc[place] prescribing law, signed [place], she said " that is bullshit, you are not right" She is therefore discharged and she will find  herself a follow up PCP on her own.  It was felt that none of her medical problem required inpatient treatment.   Discharge Diagnoses:  Principal Problem:   Abdominal pain Active Problems:   Psoriasis   Hyperglycemia   Abnormal EKG    Discharge Instructions  Discharge Instructions    Diet - low sodium heart healthy    Complete by:  As directed    Increase activity slowly    Complete by:  As directed      Allergies as of 01/29/2017      Reactions   Heparin    Penicillins    Toradol [ketorolac Tromethamine]       Medication List    STOP taking these medications   oxyCODONE-acetaminophen 5-325 MG tablet Commonly known as:  PERCOCET/ROXICET   prochlorperazine 10 MG tablet Commonly known as:  COMPAZINE       Allergies  Allergen Reactions  . Heparin   . Penicillins   . Toradol [Ketorolac Tromethamine]     Consultations:  None.    Procedures/Studies: Ct Abdomen Pelvis W Contrast  Result Date: 01/28/2017 CLINICAL DATA:  Diffuse abdominal pain, leukocytosis.  Porphyria. EXAM: CT ABDOMEN AND PELVIS WITH CONTRAST TECHNIQUE: Multidetector CT imaging of the abdomen and pelvis was performed using the standard protocol following bolus administration of intravenous contrast. CONTRAST:  100mL ISOVUE-300 IOPAMIDOL (ISOVUE-300) INJECTION 61% COMPARISON:  Acute abdominal series January 28, 2017 at 0120 hours and CT abdomen and pelvis January 14, 2017 FINDINGS: LOWER CHEST: Lung bases are clear. Included heart size is normal. No pericardial effusion. HEPATOBILIARY: A few scattered subcentimeter hypodensities in liver most compatible with cysts. Normal gallbladder. PANCREAS: Normal. SPLEEN: Normal. ADRENALS/URINARY TRACT: Kidneys are orthotopic, demonstrating symmetric enhancement. No nephrolithiasis, hydronephrosis or solid renal masses. Too small to characterize hypodensity RIGHT interpolar kidney. The unopacified ureters are normal in course and caliber. Delayed imaging through the  kidneys demonstrates symmetric prompt contrast excretion within the proximal urinary collecting system. Urinary bladder is well distended and unremarkable. Normal adrenal glands. STOMACH/BOWEL: Small hiatal hernia. Diffuse mild colonic wall thickening though colon is decompressed, limited assessment without oral contrast. The stomach, small bowel are normal in course and caliber without inflammatory changes. Normal appendix. VASCULAR/LYMPHATIC: Aortoiliac vessels are normal in course and caliber, severe calcific atherosclerosis in mild stenosis infrarenal aorta. No lymphadenopathy by CT size criteria. REPRODUCTIVE: Normal. OTHER: No intraperitoneal free fluid or free air. MUSCULOSKELETAL: Nonacute. Status post anterior abdominal wall herniorrhaphy with mesh. Moderate bilateral hip osteoarthrosis with superimposed curvilinear sclerosis femoral heads. Old L5-S1 disc extrusion with mineralization. IMPRESSION: Mild suspected colitis, assessment limited by lack of oral contrast. 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 Severe atherosclerosis. Electronically Signed   By: Awilda Metroourtnay  Bloomer M.D.   On: 01/28/2017 03:39   Ct Abdomen Pelvis W Contrast  Result Date: 01/14/2017 CLINICAL DATA:  Epigastric abdominal pain and chest pain of acute onset today. Nausea and vomiting. History of acute porphyria. EXAM: CT ABDOMEN AND PELVIS WITH CONTRAST TECHNIQUE: Multidetector CT imaging of the abdomen and  pelvis was performed using the standard protocol following bolus administration of intravenous contrast. CONTRAST:  ISOVUE-300 IOPAMIDOL (ISOVUE-300) INJECTION 61% COMPARISON:  02/18/2015 CT abdomen/pelvis. Chest and abdomen radiographs from earlier today. FINDINGS: Lower chest: No significant pulmonary nodules or acute consolidative airspace disease. Hepatobiliary: Normal liver size. Three scattered subcentimeter hypodense liver lesions, too small to characterize, stable, suggesting benign lesions. No new  discrete liver lesions. Normal gallbladder with no radiopaque cholelithiasis. No biliary ductal dilatation. Pancreas: Stable appearance of the pancreas, with no mass or significant duct dilation. Spleen: Normal size. No mass. Adrenals/Urinary Tract: Normal adrenals. No hydronephrosis. Subcentimeter hypodense renal cortical lesion in the lateral interpolar right kidney, too small to characterize, not definitely changed, suggesting a benign renal cyst. No additional renal lesions. Normal bladder. Stomach/Bowel: Grossly normal stomach. Normal caliber small bowel with no small bowel wall thickening. Normal appendix. Normal large bowel with no diverticulosis, large bowel wall thickening or pericolonic fat stranding. Vascular/Lymphatic: Atherosclerotic nonaneurysmal abdominal aorta. Patent portal, splenic, hepatic and renal veins. No pathologically enlarged lymph nodes in the abdomen or pelvis. Reproductive: Grossly normal uterus.  No adnexal mass. Other: No pneumoperitoneum, ascites or focal fluid collection. Status post ventral mid abdominal hernia mesh repair, with no evidence of a recurrent ventral abdominal hernia. Musculoskeletal: No aggressive appearing focal osseous lesions. Mild-to-moderate thoracolumbar spondylosis, most prominent at L5-S1. IMPRESSION: 1. No acute abnormality. No evidence of bowel obstruction or acute bowel inflammation. 2. Aortic atherosclerosis. Electronically Signed   By: Delbert Phenix M.D.   On: 01/14/2017 15:55   Dg Abdomen Acute W/chest  Result Date: 01/28/2017 CLINICAL DATA:  Severe acute on chronic abdominal pain, vomiting and diarrhea. History of porphyria. EXAM: DG ABDOMEN ACUTE W/ 1V CHEST COMPARISON:  CT abdomen and pelvis January 14, 2017 inch chest radiograph February 18, 2015 FINDINGS: Cardiac silhouette is upper limits of normal in size. Mediastinal silhouette is nonsuspicious. Increased lung volumes with mild chronic interstitial changes, unchanged. No pleural effusion or focal  consolidation. Single lumen RIGHT chest Port-A-Cath with distal tip at cavoatrial junction. Similar fullness of pulmonary hilar consistent with vascular shadows. Osteopenia. Bowel gas pattern is nondilated and nonobstructive. No intra-abdominal mass effect, pathologic calcifications or free air. Soft tissue planes and included osseous structures are non-suspicious. Mild vascular calcifications. Status post abdominal herniorrhaphy. IMPRESSION: Borderline cardiomegaly and probable COPD. Nonspecific bowel gas pattern. Electronically Signed   By: Awilda Metro M.D.   On: 01/28/2017 02:31   Dg Abd Acute W/chest  Result Date: 01/14/2017 CLINICAL DATA:  Nausea and vomiting for 1 hour EXAM: DG ABDOMEN ACUTE W/ 1V CHEST COMPARISON:  02/18/2015 FINDINGS: Cardiac shadow is stable. Right chest wall port is again seen and stable. The lungs demonstrate diffuse interstitial changes but stable from the prior exam. No focal infiltrate or sizable effusion is seen. Scattered large and small bowel gas is noted within the abdomen. No obstructive changes are seen. No free intraperitoneal air is noted. Postsurgical changes are seen in the midline likely related to prior hernia repair. No acute bony abnormality is seen. IMPRESSION: No acute abnormality noted in the chest and abdomen. Electronically Signed   By: Alcide Clever M.D.   On: 01/14/2017 14:42     Subjective: I would like to have some pain medication to get over this.  Discharge Exam: Vitals:   01/28/17 2227 01/29/17 0605  BP: 140/84 132/77  Pulse: 72 70  Resp: 20 18  Temp: 98.3 F (36.8 C) 98 F (36.7 C)   Vitals:   01/28/17 1730  01/28/17 1814 01/28/17 2227 01/29/17 0605  BP: 138/85 (!) 135/96 140/84 132/77  Pulse: 79 77 72 70  Resp: 16 20 20 18   Temp:  98.4 F (36.9 C) 98.3 F (36.8 C) 98 F (36.7 C)  TempSrc:  Oral Oral Oral  SpO2: 97% 100% 100% 100%  Weight:      Height:        General: Pt is alert, awake, not in acute  distress Cardiovascular: RRR, S1/S2 +, no rubs, no gallops Respiratory: CTA bilaterally, no wheezing, no rhonchi Abdominal: Soft, NT, ND, bowel sounds + Extremities: no edema, no cyanosis    The results of significant diagnostics from this hospitalization (including imaging, microbiology, ancillary and laboratory) are listed below for reference.     Basic Metabolic Panel:  Recent Labs Lab 01/27/17 2354 01/28/17 1317 01/29/17 0600  NA 137 139 137  K 3.2* 3.8 3.5  CL 108 107 106  CO2 19* 20* 23  GLUCOSE 185* 178* 103*  BUN 12 10 14   CREATININE 0.86 0.75 0.97  CALCIUM 9.2 9.4 9.0   Liver Function Tests:  Recent Labs Lab 01/27/17 2354 01/28/17 1317  AST 14* 19  ALT 8* 12*  ALKPHOS 63 69  BILITOT 0.4 0.5  PROT 7.6 8.3*  ALBUMIN 3.9 4.3    Recent Labs Lab 01/27/17 2354 01/28/17 1317  LIPASE 40 25   No results for input(s): AMMONIA in the last 168 hours. CBC:  Recent Labs Lab 01/27/17 2354 01/28/17 1317 01/29/17 0600  WBC 20.0* 16.3* 11.8*  NEUTROABS 12.3* 14.3*  --   HGB 14.0 14.2 13.4  HCT 41.6 43.0 39.7  MCV 93.5 92.7 93.6  PLT 468* 409* 316   Cardiac Enzymes:  Recent Labs Lab 01/27/17 2354  TROPONINI <0.03   CBG:  Recent Labs Lab 01/28/17 2130 01/29/17 0717  GLUCAP 126* 78   Urinalysis    Component Value Date/Time   COLORURINE YELLOW 01/28/2017 1312   APPEARANCEUR CLEAR 01/28/2017 1312   LABSPEC 1.025 01/28/2017 1312   PHURINE 6.5 01/28/2017 1312   GLUCOSEU 100 (A) 01/28/2017 1312   HGBUR MODERATE (A) 01/28/2017 1312   BILIRUBINUR NEGATIVE 01/28/2017 1312   KETONESUR TRACE (A) 01/28/2017 1312   PROTEINUR 100 (A) 01/28/2017 1312   UROBILINOGEN 1.0 02/17/2015 2038   NITRITE NEGATIVE 01/28/2017 1312   LEUKOCYTESUR NEGATIVE 01/28/2017 1312    Time coordinating discharge: Over 30 minutes SIGNED:  Houston Siren, MD FACP [facility] 01/29/2017, 9:53 AM   If 7PM-7AM, please contact night-coverage [email or web address] Password  [email or web address]

## 2017-01-30 LAB — HEMOGLOBIN A1C
HEMOGLOBIN A1C: 5 % (ref 4.8–5.6)
MEAN PLASMA GLUCOSE: 97 mg/dL

## 2017-01-30 LAB — HIV ANTIBODY (ROUTINE TESTING W REFLEX): HIV Screen 4th Generation wRfx: NONREACTIVE

## 2017-02-04 LAB — PORPHYRINS, FRACTIONATION-PLASMA
Coproporphyrin.: <1 ug/dL (ref 0.0–1.0)
Heptacarboxyl Porphyrins: <1 ug/dL (ref 0.0–1.0)
Protoporphyrin: <1 ug/dL (ref 0.0–1.0)

## 2017-02-06 ENCOUNTER — Emergency Department (HOSPITAL_COMMUNITY)
Admission: EM | Admit: 2017-02-06 | Discharge: 2017-02-06 | Disposition: A | Discharge status: Home / Self Care | Payer: Medicaid - Out of State | Attending: Emergency Medicine | Admitting: Emergency Medicine

## 2017-02-06 ENCOUNTER — Encounter (HOSPITAL_COMMUNITY): Payer: Self-pay

## 2017-02-06 ENCOUNTER — Emergency Department (HOSPITAL_COMMUNITY): Payer: Medicaid - Out of State

## 2017-02-06 DIAGNOSIS — F17210 Nicotine dependence, cigarettes, uncomplicated: Secondary | ICD-10-CM | POA: Insufficient documentation

## 2017-02-06 DIAGNOSIS — F1290 Cannabis use, unspecified, uncomplicated: Secondary | ICD-10-CM | POA: Diagnosis not present

## 2017-02-06 DIAGNOSIS — J40 Bronchitis, not specified as acute or chronic: Secondary | ICD-10-CM | POA: Diagnosis not present

## 2017-02-06 DIAGNOSIS — R0602 Shortness of breath: Secondary | ICD-10-CM | POA: Diagnosis present

## 2017-02-06 LAB — COMPREHENSIVE METABOLIC PANEL
ALT: 9 U/L — ABNORMAL LOW (ref 14–54)
AST: 22 U/L (ref 15–41)
Albumin: 3.7 g/dL (ref 3.5–5.0)
Alkaline Phosphatase: 54 U/L (ref 38–126)
Anion gap: 9 (ref 5–15)
BUN: 9 mg/dL (ref 6–20)
CHLORIDE: 107 mmol/L (ref 101–111)
CO2: 20 mmol/L — ABNORMAL LOW (ref 22–32)
Calcium: 8.9 mg/dL (ref 8.9–10.3)
Creatinine, Ser: 0.83 mg/dL (ref 0.44–1.00)
Glucose, Bld: 98 mg/dL (ref 65–99)
POTASSIUM: 3.9 mmol/L (ref 3.5–5.1)
Sodium: 136 mmol/L (ref 135–145)
Total Bilirubin: 0.7 mg/dL (ref 0.3–1.2)
Total Protein: 7.4 g/dL (ref 6.5–8.1)

## 2017-02-06 LAB — CBC WITH DIFFERENTIAL/PLATELET
BASOS ABS: 0.1 10*3/uL (ref 0.0–0.1)
BASOS PCT: 1 %
EOS PCT: 0 %
Eosinophils Absolute: 0 10*3/uL (ref 0.0–0.7)
HCT: 41.3 % (ref 36.0–46.0)
Hemoglobin: 14.2 g/dL (ref 12.0–15.0)
LYMPHS PCT: 27 %
Lymphs Abs: 1.5 10*3/uL (ref 0.7–4.0)
MCH: 31.7 pg (ref 26.0–34.0)
MCHC: 34.4 g/dL (ref 30.0–36.0)
MCV: 92.2 fL (ref 78.0–100.0)
MONO ABS: 0.4 10*3/uL (ref 0.1–1.0)
Monocytes Relative: 6 %
Neutro Abs: 3.7 10*3/uL (ref 1.7–7.7)
Neutrophils Relative %: 66 %
PLATELETS: 264 10*3/uL (ref 150–400)
RBC: 4.48 MIL/uL (ref 3.87–5.11)
RDW: 13.2 % (ref 11.5–15.5)
WBC: 5.6 10*3/uL (ref 4.0–10.5)

## 2017-02-06 LAB — INFLUENZA PANEL BY PCR (TYPE A & B)
INFLAPCR: NEGATIVE
INFLBPCR: NEGATIVE

## 2017-02-06 MED ORDER — ACETAMINOPHEN 325 MG PO TABS
650.0000 mg | ORAL_TABLET | Freq: Once | ORAL | Status: AC
  Administered 2017-02-06: 650 mg via ORAL
  Filled 2017-02-06: qty 2

## 2017-02-06 MED ORDER — METHYLPREDNISOLONE SODIUM SUCC 125 MG IJ SOLR
125.0000 mg | Freq: Once | INTRAMUSCULAR | Status: AC
  Administered 2017-02-06: 125 mg via INTRAVENOUS
  Filled 2017-02-06: qty 2

## 2017-02-06 MED ORDER — SODIUM CHLORIDE 0.9 % IV BOLUS (SEPSIS)
1000.0000 mL | Freq: Once | INTRAVENOUS | Status: AC
  Administered 2017-02-06: 1000 mL via INTRAVENOUS

## 2017-02-06 MED ORDER — ALBUTEROL SULFATE (2.5 MG/3ML) 0.083% IN NEBU: 2.5000 mg | INHALATION_SOLUTION | Freq: Once | RESPIRATORY_TRACT | Status: DC

## 2017-02-06 MED ORDER — HYDROMORPHONE HCL 1 MG/ML IJ SOLN
1.0000 mg | Freq: Once | INTRAMUSCULAR | Status: AC
  Administered 2017-02-06: 1 mg via INTRAVENOUS
  Filled 2017-02-06: qty 1

## 2017-02-06 MED ORDER — PROCHLORPERAZINE EDISYLATE 5 MG/ML IJ SOLN
10.0000 mg | Freq: Once | INTRAMUSCULAR | Status: AC
  Administered 2017-02-06: 10 mg via INTRAVENOUS
  Filled 2017-02-06: qty 2

## 2017-02-06 MED ORDER — ALBUTEROL SULFATE HFA 108 (90 BASE) MCG/ACT IN AERS
2.0000 | INHALATION_SPRAY | RESPIRATORY_TRACT | Status: DC | PRN
  Administered 2017-02-06: 2 via RESPIRATORY_TRACT
  Filled 2017-02-06: qty 6.7

## 2017-02-06 MED ORDER — IPRATROPIUM-ALBUTEROL 0.5-2.5 (3) MG/3ML IN SOLN
3.0000 mL | Freq: Once | RESPIRATORY_TRACT | Status: AC
  Administered 2017-02-06: 3 mL via RESPIRATORY_TRACT
  Filled 2017-02-06: qty 3

## 2017-02-06 MED ORDER — SULFAMETHOXAZOLE-TRIMETHOPRIM 800-160 MG PO TABS: 1.0000 | ORAL_TABLET | Freq: Two times a day (BID) | ORAL | 0 refills | Status: AC

## 2017-02-06 NOTE — Discharge Instructions (Signed)
Follow up with your md next week if not improving °

## 2017-02-06 NOTE — ED Triage Notes (Signed)
Pt brought by EMS for SOB and cough for couple days. Now feels achey all over

## 2017-02-06 NOTE — ED Provider Notes (Signed)
[facility]-EM[facility]ENCY DEPT Provider Note   CSN: [id number] Arrival date & time: 02/06/17  1528     History   Chief Complaint Chief Complaint  Patient presents with  . Shortness of Breath    HPI Lauren Atkinson is a 56 y.o. female.  Patient complains of cough shortness of breath wheezing   The history is provided by the patient. No language interpreter was used.  Shortness of Breath  This is a new problem. The problem occurs continuously.The current episode started 12 to 24 hours ago. The problem has not changed since onset.Associated symptoms include cough. Pertinent negatives include no headaches, no chest pain, no abdominal pain and no rash. It is unknown what precipitated the problem. Risk factors include smoking.    Past Medical History:  Diagnosis Date  . Acute porphyria (HCC)   . Anxiety   . Chronic abdominal pain   . Depression   . Dysrhythmia    a fib  . H/O myocardial ischemia    with cardiomyopathy and prolonged QT, per patient report  . Seizures (HCC)   . Stroke [facility](HCC)    "2, at least"    Patient Active Problem List   Diagnosis Date Noted  . Abdominal pain 01/28/2017  . Hyperglycemia 01/28/2017  . Abnormal EKG 01/28/2017  . Generalized abdominal pain   . Intractable nausea and vomiting 02/18/2015  . 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 Chronic abdominal pain 02/18/2015  . Anxiety 02/18/2015  . Depression 02/18/2015  . Seizure (HCC) 02/18/2015  . CVA (cerebral infarction) 02/18/2015  . Atrial fibrillation (HCC) 02/18/2015  . Psoriasis 02/18/2015  . Protein-calorie malnutrition, severe (HCC) 02/18/2015  . Acute epigastric pain   . Porphyria (HCC)   . Prolonged QT interval   . Blood poisoning (HCC)   . Leukocytosis     Past Surgical History:  Procedure Laterality Date  . abdominal patch      OB History    No data available       Home Medications    Prior to Admission medications   Medication Sig Start Date End Date  Taking? Authorizing Provider  sulfamethoxazole-trimethoprim (BACTRIM DS,SEPTRA DS) 800-160 MG tablet Take 1 tablet by mouth 2 (two) times daily. 02/06/17 02/13/17  Bethann BerkshireJoseph Sorrel Cassetta, MD    Family History No family history on file.  Social History Social History  Substance Use Topics  . Smoking status: Current Every Day Smoker    Packs/day: 1.00    Types: Cigarettes  . Smokeless tobacco: Never Used  . Alcohol use No     Allergies   Heparin; Penicillins; and Toradol [ketorolac tromethamine]   Review of Systems Review of Systems  Constitutional: Negative for appetite change and fatigue.  HENT: Negative for congestion, ear discharge and sinus pressure.   Eyes: Negative for discharge.  Respiratory: Positive for cough and shortness of breath.   Cardiovascular: Negative for chest pain.  Gastrointestinal: Negative for abdominal pain and diarrhea.  Genitourinary: Negative for frequency and hematuria.  Musculoskeletal: Negative for back pain.  Skin: Negative for rash.  Neurological: Negative for seizures and headaches.  Psychiatric/Behavioral: Negative for hallucinations.     Physical Exam Updated Vital Signs BP 126/82 (BP Location: Right Arm)   Pulse 92   Temp 98.4 F (36.9 C) (Oral)   Resp 20   Ht 5\' 7"  (1.702 m)   Wt 110 lb (49.9 kg)   SpO2 94%   BMI 17.23 kg/m   Physical Exam  Constitutional: She is oriented to person, place,  and time. She appears well-developed.  HENT:  Head: Normocephalic.  Eyes: Conjunctivae and EOM are normal. No scleral icterus.  Neck: Neck supple. No thyromegaly present.  Cardiovascular: Normal rate and regular rhythm.  Exam reveals no gallop and no friction rub.   No murmur heard. Pulmonary/Chest: No stridor. She has wheezes. She has no rales. She exhibits no tenderness.  Abdominal: She exhibits no distension. There is no tenderness. There is no rebound.  Musculoskeletal: Normal range of motion. She exhibits no edema.  Lymphadenopathy:    She  has no cervical adenopathy.  Neurological: She is oriented to person, place, and time. She exhibits normal muscle tone. Coordination normal.  Skin: No rash noted. No erythema.  Psychiatric: She has a normal mood and affect. Her behavior is normal.     ED Treatments / Results  Labs (all labs ordered are listed, but only abnormal results are displayed) Labs Reviewed  COMPREHENSIVE METABOLIC PANEL - Abnormal; Notable for the following:       Result Value   CO2 20 (*)    ALT 9 (*)    All other components within normal limits  CBC WITH DIFFERENTIAL/PLATELET  INFLUENZA PANEL BY PCR (TYPE A & B)    EKG  EKG Interpretation None       Radiology Dg Chest 2 View  Result Date: 02/06/2017 CLINICAL DATA:  Shortness of breath. EXAM: CHEST  2 VIEW COMPARISON:  Radiograph of February 18, 2015. FINDINGS: The heart size and mediastinal contours are within normal limits. Both lungs are clear. No pneumothorax or pleural effusion is noted. Right internal jugular Port-A-Cath is unchanged with distal tip in expected position of the SVC. The visualized skeletal structures are unremarkable. IMPRESSION: No active cardiopulmonary disease. Electronically Signed   By: Lupita Raider, M.D.   On: 02/06/2017 16:15    Procedures Procedures (including critical care time)  Medications Ordered in ED Medications  albuterol (PROVENTIL) (2.5 MG/3ML) 0.083% nebulizer solution 2.5 mg (2.5 mg Nebulization Not Given 02/06/17 1625)  albuterol (PROVENTIL) (2.5 MG/3ML) 0.083% nebulizer solution 2.5 mg (2.5 mg Nebulization Not Given 02/06/17 1711)  albuterol (PROVENTIL HFA;VENTOLIN HFA) 108 (90 Base) MCG/ACT inhaler 2 puff (not administered)  ipratropium-albuterol (DUONEB) 0.5-2.5 (3) MG/3ML nebulizer solution 3 mL (3 mLs Nebulization Given 02/06/17 1622)  methylPREDNISolone sodium succinate (SOLU-MEDROL) 125 mg/2 mL injection 125 mg (125 mg Intravenous Given 02/06/17 1619)  acetaminophen (TYLENOL) tablet 650 mg (650 mg Oral  Given 02/06/17 1658)  sodium chloride 0.9 % bolus 1,000 mL (1,000 mLs Intravenous New Bag/Given 02/06/17 1702)  ipratropium-albuterol (DUONEB) 0.5-2.5 (3) MG/3ML nebulizer solution 3 mL (3 mLs Nebulization Given 02/06/17 1710)  HYDROmorphone (DILAUDID) injection 1 mg (1 mg Intravenous Given 02/06/17 1822)  prochlorperazine (COMPAZINE) injection 10 mg (10 mg Intravenous Given 02/06/17 1824)     Initial Impression / Assessment and Plan / ED Course  I have reviewed the triage vital signs and the nursing notes.  Pertinent labs & imaging results that were available during my care of the patient were reviewed by me and considered in my medical decision making (see chart for details).     Patient with bronchitis and bronchospasm. Patient will be put on Bactrim and given albuterol inhaler will follow-up not improving  Final Clinical Impressions(s) / ED Diagnoses   Final diagnoses:  Bronchitis    New Prescriptions New Prescriptions   SULFAMETHOXAZOLE-TRIMETHOPRIM (BACTRIM DS,SEPTRA DS) 800-160 MG TABLET    Take 1 tablet by mouth 2 (two) times daily.     Jomarie Longs  Estell Harpin, MD 02/06/17 1850

## 2017-02-06 NOTE — ED Notes (Signed)
Pt crying stating she is hurting all over. Dr Aileen PilotZammitt notified

## 2017-03-04 ENCOUNTER — Encounter (HOSPITAL_COMMUNITY): Payer: Self-pay

## 2017-03-04 ENCOUNTER — Observation Stay (HOSPITAL_COMMUNITY)
Admission: EM | Admit: 2017-03-04 | Discharge: 2017-03-04 | Disposition: A | Discharge status: Home / Self Care | Payer: Medicaid - Out of State | Attending: Internal Medicine | Admitting: Internal Medicine

## 2017-03-04 DIAGNOSIS — F1290 Cannabis use, unspecified, uncomplicated: Secondary | ICD-10-CM | POA: Insufficient documentation

## 2017-03-04 DIAGNOSIS — F17210 Nicotine dependence, cigarettes, uncomplicated: Secondary | ICD-10-CM | POA: Insufficient documentation

## 2017-03-04 DIAGNOSIS — R1084 Generalized abdominal pain: Principal | ICD-10-CM | POA: Insufficient documentation

## 2017-03-04 DIAGNOSIS — R1010 Upper abdominal pain, unspecified: Secondary | ICD-10-CM

## 2017-03-04 DIAGNOSIS — Z79899 Other long term (current) drug therapy: Secondary | ICD-10-CM | POA: Insufficient documentation

## 2017-03-04 DIAGNOSIS — R109 Unspecified abdominal pain: Secondary | ICD-10-CM | POA: Diagnosis present

## 2017-03-04 LAB — RAPID URINE DRUG SCREEN, HOSP PERFORMED
AMPHETAMINES: NOT DETECTED
Barbiturates: NOT DETECTED
Benzodiazepines: NOT DETECTED
COCAINE: NOT DETECTED
OPIATES: NOT DETECTED
TETRAHYDROCANNABINOL: POSITIVE — AB

## 2017-03-04 LAB — URINALYSIS, ROUTINE W REFLEX MICROSCOPIC
Bilirubin Urine: NEGATIVE
GLUCOSE, UA: NEGATIVE mg/dL
KETONES UR: 15 mg/dL — AB
LEUKOCYTES UA: NEGATIVE
Nitrite: NEGATIVE
PH: 5 (ref 5.0–8.0)
PROTEIN: 30 mg/dL — AB
Specific Gravity, Urine: >1.03 — ABNORMAL HIGH (ref 1.005–1.030)

## 2017-03-04 LAB — COMPREHENSIVE METABOLIC PANEL
ALT: 9 U/L — ABNORMAL LOW (ref 14–54)
ANION GAP: 13 (ref 5–15)
AST: 17 U/L (ref 15–41)
Albumin: 4 g/dL (ref 3.5–5.0)
Alkaline Phosphatase: 67 U/L (ref 38–126)
BUN: 11 mg/dL (ref 6–20)
CALCIUM: 9.3 mg/dL (ref 8.9–10.3)
CHLORIDE: 108 mmol/L (ref 101–111)
CO2: 19 mmol/L — AB (ref 22–32)
Creatinine, Ser: 0.97 mg/dL (ref 0.44–1.00)
GFR calc non Af Amer: >60 mL/min (ref >60)
Glucose, Bld: 155 mg/dL — ABNORMAL HIGH (ref 65–99)
Potassium: 3.7 mmol/L (ref 3.5–5.1)
SODIUM: 140 mmol/L (ref 135–145)
Total Bilirubin: 0.5 mg/dL (ref 0.3–1.2)
Total Protein: 8.3 g/dL — ABNORMAL HIGH (ref 6.5–8.1)

## 2017-03-04 LAB — URINALYSIS, MICROSCOPIC (REFLEX): WBC, UA: NONE SEEN WBC/hpf (ref 0–5)

## 2017-03-04 LAB — CBC WITH DIFFERENTIAL/PLATELET
Basophils Absolute: 0.1 10*3/uL (ref 0.0–0.1)
Basophils Relative: 1 %
EOS ABS: 0.1 10*3/uL (ref 0.0–0.7)
EOS PCT: 1 %
HCT: 46.3 % — ABNORMAL HIGH (ref 36.0–46.0)
Hemoglobin: 14.9 g/dL (ref 12.0–15.0)
LYMPHS ABS: 2.2 10*3/uL (ref 0.7–4.0)
Lymphocytes Relative: 15 %
MCH: 29.6 pg (ref 26.0–34.0)
MCHC: 32.2 g/dL (ref 30.0–36.0)
MCV: 91.9 fL (ref 78.0–100.0)
MONO ABS: 0.5 10*3/uL (ref 0.1–1.0)
MONOS PCT: 3 %
Neutro Abs: 12.2 10*3/uL — ABNORMAL HIGH (ref 1.7–7.7)
Neutrophils Relative %: 80 %
Platelets: 396 10*3/uL (ref 150–400)
RBC: 5.04 MIL/uL (ref 3.87–5.11)
RDW: 13.2 % (ref 11.5–15.5)
WBC: 15 10*3/uL — ABNORMAL HIGH (ref 4.0–10.5)

## 2017-03-04 LAB — LIPASE, BLOOD: LIPASE: 39 U/L (ref 11–51)

## 2017-03-04 MED ORDER — SODIUM CHLORIDE 0.9 % IV BOLUS (SEPSIS)
1000.0000 mL | Freq: Once | INTRAVENOUS | Status: AC
  Administered 2017-03-04: 1000 mL via INTRAVENOUS

## 2017-03-04 MED ORDER — HYDROMORPHONE HCL 2 MG/ML IJ SOLN
2.0000 mg | Freq: Once | INTRAMUSCULAR | Status: AC
  Administered 2017-03-04: 2 mg via INTRAVENOUS
  Filled 2017-03-04: qty 1

## 2017-03-04 MED ORDER — BOOST / RESOURCE BREEZE PO LIQD: 1.0000 | Freq: Three times a day (TID) | ORAL | Status: DC

## 2017-03-04 MED ORDER — LORAZEPAM 0.5 MG PO TABS
0.2500 mg | ORAL_TABLET | Freq: Once | ORAL | Status: AC
  Administered 2017-03-04: 0.25 mg via ORAL
  Filled 2017-03-04: qty 1

## 2017-03-04 MED ORDER — DEXAMETHASONE SODIUM PHOSPHATE 10 MG/ML IJ SOLN
10.0000 mg | Freq: Once | INTRAMUSCULAR | Status: AC
  Administered 2017-03-04: 10 mg via INTRAVENOUS
  Filled 2017-03-04: qty 1

## 2017-03-04 MED ORDER — LORAZEPAM 2 MG/ML IJ SOLN
2.0000 mg | Freq: Once | INTRAMUSCULAR | Status: AC
  Administered 2017-03-04: 2 mg via INTRAVENOUS
  Filled 2017-03-04: qty 1

## 2017-03-04 NOTE — Discharge Summary (Signed)
I have received this patient on the floor as a carryover admission. She is already in a room by the time I see her. 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 She says she was diagnosed in [place], [place] over 7 years ago, was told she was going to die and was placed in a nursing home. She says "when I didn't die I was sent out of the nursing home and they basically dumped me in the street, so I came to [place], [place] to live with my brother". She has never seen a physician in [place] other than hospital and ED physicians. She has repeatedly, thruout my conversation with her, stated that no doctor has been able to help her and just keep sending her to specialists. She is crying and frustrated. She is NOT in extreme pain. She states she has no narcotic prescriptions at home. When asked what she expects of this hospitalization she says "just help me get through this crisis and I can manage from there". I have explained repeatedly that porphyria is a chronic illness without cure that mandates regular OP follow up. She refuses to see OP doctors. Have explained to her that I will not be providing narcotics in the hospital and then send her home without OP follow up. She visibly gets anxious and frustrated about this and starts crying and speaking above my voice, not willing to listen to what we have to say. Have told her that I will have our case manager bring her a list of OP providers in the area that are accepting new patients in case she changes her mind. She will be sent home today as I see no reason for admission.  Peggye Pitt, MD [facility] Pager: [phone]

## 2017-03-04 NOTE — ED Provider Notes (Signed)
[facility]-EMERGENCY DEPT Provider Note   CSN: [id number] Arrival date & time: 03/04/17  0342     History   Chief Complaint Chief Complaint  Patient presents with  . Abdominal Pain    HPI Lauren Atkinson is a 56 y.o. female.  Patient presents to the ER for evaluation of abdominal pain. Patient reports a long history of acute episodes of abdominal pain. 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 Episodes occur intermittently with severe upper abdominal pain, nausea, vomiting and sweating.      Past Medical History:  Diagnosis Date  . Acute porphyria (HCC)   . Anxiety   . Chronic abdominal pain   . Depression   . Dysrhythmia    a fib  . H/O myocardial ischemia    with cardiomyopathy and prolonged QT, per patient report  . Seizures (HCC)   . Stroke [facility])    "2, at least"    Patient Active Problem List   Diagnosis Date Noted  . Abdominal pain 01/28/2017  . Hyperglycemia 01/28/2017  . Abnormal EKG 01/28/2017  . Generalized abdominal pain   . Intractable nausea and vomiting 02/18/2015  . 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 Chronic abdominal pain 02/18/2015  . Anxiety 02/18/2015  . Depression 02/18/2015  . Seizure (HCC) 02/18/2015  . CVA (cerebral infarction) 02/18/2015  . Atrial fibrillation (HCC) 02/18/2015  . Psoriasis 02/18/2015  . Protein-calorie malnutrition, severe (HCC) 02/18/2015  . Acute epigastric pain   . Porphyria (HCC)   . Prolonged QT interval   . Blood poisoning   . Leukocytosis     Past Surgical History:  Procedure Laterality Date  . abdominal patch      OB History    No data available       Home Medications    Prior to Admission medications   Medication Sig Start Date End Date Taking? Authorizing Provider  Cholecalciferol (VITAMIN D PO) Take 1 tablet by mouth daily.   Yes Historical Provider, MD  Multiple Vitamin (MULTIVITAMIN WITH MINERALS) TABS tablet Take 1 tablet by mouth daily.   Yes Historical Provider,  MD    Family History No family history on file.  Social History Social History  Substance Use Topics  . Smoking status: Current Every Day Smoker    Packs/day: 1.00    Types: Cigarettes  . Smokeless tobacco: Never Used  . Alcohol use No     Allergies   Heparin; Penicillins; and Toradol [ketorolac tromethamine]   Review of Systems Review of Systems  Constitutional: Positive for diaphoresis.  Gastrointestinal: Positive for abdominal pain, nausea and vomiting.  All other systems reviewed and are negative.    Physical Exam Updated Vital Signs BP 94/75 (BP Location: Left Arm)   Pulse 73   Temp 97.5 F (36.4 C) (Oral)   Resp 18   SpO2 100%   Physical Exam  Constitutional: She is oriented to person, place, and time. She appears well-developed and well-nourished. She appears distressed.  HENT:  Head: Normocephalic and atraumatic.  Right Ear: Hearing normal.  Left Ear: Hearing normal.  Nose: Nose normal.  Mouth/Throat: Oropharynx is clear and moist and mucous membranes are normal.  Eyes: Conjunctivae and EOM are normal. Pupils are equal, round, and reactive to light.  Neck: Normal range of motion. Neck supple.  Cardiovascular: Regular rhythm, S1 normal and S2 normal.  Exam reveals no gallop and no friction rub.   No murmur heard. Pulmonary/Chest: Effort normal and breath sounds normal. No respiratory distress.  She exhibits no tenderness.  Abdominal: Soft. Normal appearance and bowel sounds are normal. There is no hepatosplenomegaly. There is tenderness. There is no rebound, no guarding, no tenderness at McBurney's point and negative Murphy's sign. No hernia.  Musculoskeletal: Normal range of motion.  Neurological: She is alert and oriented to person, place, and time. She has normal strength. No cranial nerve deficit or sensory deficit. Coordination normal. GCS eye subscore is 4. GCS verbal subscore is 5. GCS motor subscore is 6.  Skin: Skin is warm and intact. No rash  noted. She is diaphoretic. No cyanosis.  Psychiatric: She has a normal mood and affect. Her speech is normal and behavior is normal. Thought content normal.  Nursing note and vitals reviewed.    ED Treatments / Results  Labs (all labs ordered are listed, but only abnormal results are displayed) Labs Reviewed  CBC WITH DIFFERENTIAL/PLATELET - Abnormal; Notable for the following:       Result Value   WBC 15.0 (*)    HCT 46.3 (*)    Neutro Abs 12.2 (*)    All other components within normal limits  COMPREHENSIVE METABOLIC PANEL - Abnormal; Notable for the following:    CO2 19 (*)    Glucose, Bld 155 (*)    Total Protein 8.3 (*)    ALT 9 (*)    All other components within normal limits  RAPID URINE DRUG SCREEN, HOSP PERFORMED - Abnormal; Notable for the following:    Tetrahydrocannabinol POSITIVE (*)    All other components within normal limits  URINALYSIS, ROUTINE W REFLEX MICROSCOPIC - Abnormal; Notable for the following:    Specific Gravity, Urine >1.030 (*)    Hgb urine dipstick TRACE (*)    Ketones, ur 15 (*)    Protein, ur 30 (*)    All other components within normal limits  URINALYSIS, MICROSCOPIC (REFLEX) - Abnormal; Notable for the following:    Bacteria, UA FEW (*)    Squamous Epithelial / LPF 0-5 (*)    All other components within normal limits  LIPASE, BLOOD  PORPHOBILINOGEN, RANDOM URINE    EKG  EKG Interpretation  Date/Time:  Thursday March 04 2017 04:10:51 EDT Ventricular Rate:  66 PR Interval:    QRS Duration: 106 QT Interval:  576 QTC Calculation: 604 R Axis:   98 Text Interpretation:  Sinus rhythm Probable left atrial enlargement Borderline right axis deviation Prolonged QT interval Confirmed by Blinda Leatherwood  MD, Tahlia Deamer ([id number]) on 03/04/2017 4:19:09 AM       Radiology No results found.  Procedures Procedures (including critical care time)  Medications Ordered in ED Medications  sodium chloride 0.9 % bolus 1,000 mL (1,000 mLs Intravenous  Transfusing/Transfer 03/04/17 0648)  dexamethasone (DECADRON) injection 10 mg (10 mg Intravenous Given 03/04/17 0456)  LORazepam (ATIVAN) injection 2 mg (2 mg Intravenous Given 03/04/17 0455)  HYDROmorphone (DILAUDID) injection 2 mg (2 mg Intravenous Given 03/04/17 0611)  LORazepam (ATIVAN) tablet 0.25 mg (0.25 mg Oral Given 03/04/17 1143)     Initial Impression / Assessment and Plan / ED Course  I have reviewed the triage vital signs and the nursing notes.  Pertinent labs & imaging results that were available during my care of the patient were reviewed by me and considered in my medical decision making (see chart for details).     Patient presented with complaints of abdominal pain. Patient has a stated history of porphyria. This was confirmed from previous notes from visits in the past, porphyrins elevated in 2016.  She was, however, admitted several weeks ago with similar complaints of abdominal pain in her porphyrins were negative. Patient is difficult in that she clearly has chronic abdominal pain complaints, possibly cyclic vomiting complaints as well as porphyria. It is difficult to ascertain what the current presentation is. Patient has been screaming in discomfort throughout the visit here in the ER.  Case was discussed with Dr. Shirline Frees, on-call for hematology. He recommended that the patient be admitted to help sort this out. He recommended having local hematology/oncology consult on the patient during hospital stay to help determine a treatment course for this complex patient.  Final Clinical Impressions(s) / ED Diagnoses   Final diagnoses:  Generalized abdominal pain  Porphyria, unspecified porphyria type [facility])    New Prescriptions Discharge Medication List as of 03/04/2017  1:52 PM       Gilda Crease, MD 03/04/17 2334

## 2017-03-04 NOTE — ED Triage Notes (Signed)
Pt in by ems for abd pain and vomiting that started approx 1 hour ago

## 2017-03-04 NOTE — Care Management (Signed)
Pt provided list of PCP's in [place] accepting new patients.

## 2017-03-04 NOTE — H&P (Signed)
I have received this patient on the floor as a carryover admission. She is already in a room by the time I see her. 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 She says she was diagnosed in [place], [place] over 7 years ago, was told she was going to die and was placed in a nursing home. She says "when I didn't die I was sent out of the nursing home and they basically dumped me in the street, so I came to [place] to live with my brother". She has never seen a physician in [place] other than hospital and ED physicians. She has repeatedly, thruout my conversation with her, stated that no doctor has been able to help her and just keep sending her to specialists. She is crying and frustrated. She is NOT in extreme pain. She states she has no narcotic prescriptions at home. When asked what she expects of this hospitalization she says "just help me get through this crisis and I can manage from there". I have explained repeatedly that porphyria is a chronic illness without cure that mandates regular OP follow up. She refuses to see OP doctors. Have explained to her that I will not be providing narcotics in the hospital and then send her home without OP follow up. She visibly gets anxious and frustrated about this and starts crying and speaking above my voice, not willing to listen to what we have to say. Have told her that I will have our case manager bring her a list of OP providers in the area that are accepting new patients in case she changes her mind. She will be sent home today as I see no reason for admission.  Estela Hernandez, MD [facility] Pager: [phone] 

## 2017-03-05 NOTE — Progress Notes (Signed)
IV discontinued, catheter intact. Discharge instructions given on medications,and follow up visits, patient verbalized understanding. Accompanied patient to an awaiting vehicle.

## 2017-03-08 LAB — PORPHOBILINOGEN, RANDOM URINE: QUANTITATIVE PORPHOBILINOGEN: 2.2 mg/L — AB (ref 0.0–2.0)

## 2017-08-05 ENCOUNTER — Observation Stay (HOSPITAL_COMMUNITY)
Admission: EM | Admit: 2017-08-05 | Discharge: 2017-08-07 | Disposition: A | Discharge status: Home / Self Care | Payer: Self-pay | Attending: Internal Medicine | Admitting: Internal Medicine

## 2017-08-05 ENCOUNTER — Emergency Department (HOSPITAL_COMMUNITY): Payer: Self-pay

## 2017-08-05 ENCOUNTER — Encounter (HOSPITAL_COMMUNITY): Payer: Self-pay | Admitting: Emergency Medicine

## 2017-08-05 DIAGNOSIS — E872 Acidosis, unspecified: Secondary | ICD-10-CM | POA: Diagnosis present

## 2017-08-05 DIAGNOSIS — R1013 Epigastric pain: Secondary | ICD-10-CM | POA: Diagnosis present

## 2017-08-05 DIAGNOSIS — F17210 Nicotine dependence, cigarettes, uncomplicated: Secondary | ICD-10-CM | POA: Insufficient documentation

## 2017-08-05 DIAGNOSIS — R109 Unspecified abdominal pain: Secondary | ICD-10-CM

## 2017-08-05 LAB — BASIC METABOLIC PANEL
ANION GAP: 13 (ref 5–15)
BUN: 9 mg/dL (ref 6–20)
CALCIUM: 9.3 mg/dL (ref 8.9–10.3)
CHLORIDE: 110 mmol/L (ref 101–111)
CO2: 19 mmol/L — AB (ref 22–32)
Creatinine, Ser: 0.89 mg/dL (ref 0.44–1.00)
GFR calc non Af Amer: >60 mL/min (ref >60)
Glucose, Bld: 168 mg/dL — ABNORMAL HIGH (ref 65–99)
Potassium: 4.2 mmol/L (ref 3.5–5.1)
SODIUM: 142 mmol/L (ref 135–145)

## 2017-08-05 LAB — COMPREHENSIVE METABOLIC PANEL
ALBUMIN: 4.3 g/dL (ref 3.5–5.0)
ALK PHOS: 71 U/L (ref 38–126)
ALT: 12 U/L — ABNORMAL LOW (ref 14–54)
AST: 21 U/L (ref 15–41)
Anion gap: 13 (ref 5–15)
BUN: 10 mg/dL (ref 6–20)
CALCIUM: 9.8 mg/dL (ref 8.9–10.3)
CHLORIDE: 106 mmol/L (ref 101–111)
CO2: 23 mmol/L (ref 22–32)
CREATININE: 1.02 mg/dL — AB (ref 0.44–1.00)
GFR calc Af Amer: >60 mL/min (ref >60)
GFR calc non Af Amer: >60 mL/min (ref >60)
GLUCOSE: 186 mg/dL — AB (ref 65–99)
Potassium: 3.9 mmol/L (ref 3.5–5.1)
SODIUM: 142 mmol/L (ref 135–145)
Total Bilirubin: 0.6 mg/dL (ref 0.3–1.2)
Total Protein: 8 g/dL (ref 6.5–8.1)

## 2017-08-05 LAB — TROPONIN I: Troponin I: <0.03 ng/mL (ref <0.03)

## 2017-08-05 LAB — CBC
HCT: 48.2 % — ABNORMAL HIGH (ref 36.0–46.0)
HEMOGLOBIN: 16.6 g/dL — AB (ref 12.0–15.0)
MCH: 31.7 pg (ref 26.0–34.0)
MCHC: 34.4 g/dL (ref 30.0–36.0)
MCV: 92.2 fL (ref 78.0–100.0)
Platelets: 374 10*3/uL (ref 150–400)
RBC: 5.23 MIL/uL — AB (ref 3.87–5.11)
RDW: 13.5 % (ref 11.5–15.5)
WBC: 15.8 10*3/uL — AB (ref 4.0–10.5)

## 2017-08-05 LAB — I-STAT CG4 LACTIC ACID, ED: Lactic Acid, Venous: 2.73 mmol/L (ref 0.5–1.9)

## 2017-08-05 LAB — LIPASE, BLOOD: Lipase: 38 U/L (ref 11–51)

## 2017-08-05 MED ORDER — FENTANYL CITRATE (PF) 100 MCG/2ML IJ SOLN
100.0000 ug | Freq: Once | INTRAMUSCULAR | Status: AC
  Administered 2017-08-05: 100 ug via INTRAVENOUS
  Filled 2017-08-05: qty 2

## 2017-08-05 MED ORDER — ONDANSETRON HCL 4 MG/2ML IJ SOLN: 4.0000 mg | Freq: Four times a day (QID) | INTRAMUSCULAR | Status: DC | PRN

## 2017-08-05 MED ORDER — HYDROMORPHONE HCL 1 MG/ML IJ SOLN
1.0000 mg | Freq: Once | INTRAMUSCULAR | Status: AC
  Administered 2017-08-05: 1 mg via INTRAVENOUS
  Filled 2017-08-05: qty 1

## 2017-08-05 MED ORDER — MORPHINE SULFATE (PF) 2 MG/ML IV SOLN
INTRAVENOUS | Status: AC
  Filled 2017-08-05: qty 1

## 2017-08-05 MED ORDER — ONDANSETRON HCL 4 MG PO TABS: 4.0000 mg | ORAL_TABLET | Freq: Four times a day (QID) | ORAL | Status: DC | PRN

## 2017-08-05 MED ORDER — MORPHINE SULFATE (PF) 2 MG/ML IV SOLN
2.0000 mg | INTRAVENOUS | Status: DC | PRN
  Administered 2017-08-05 – 2017-08-07 (×11): 2 mg via INTRAVENOUS
  Filled 2017-08-05 (×11): qty 1

## 2017-08-05 MED ORDER — ACETAMINOPHEN 325 MG PO TABS: 650.0000 mg | ORAL_TABLET | Freq: Four times a day (QID) | ORAL | Status: DC | PRN

## 2017-08-05 MED ORDER — SODIUM CHLORIDE 0.9 % IV BOLUS (SEPSIS)
1000.0000 mL | Freq: Once | INTRAVENOUS | Status: AC
  Administered 2017-08-05: 1000 mL via INTRAVENOUS

## 2017-08-05 MED ORDER — KCL IN DEXTROSE-NACL 20-5-0.45 MEQ/L-%-% IV SOLN
INTRAVENOUS | Status: DC
  Administered 2017-08-05 – 2017-08-07 (×4): via INTRAVENOUS

## 2017-08-05 MED ORDER — HEMIN 313 MG IV SOLR: 4.0000 mg/kg/d | Freq: Every day | INTRAVENOUS | Status: DC

## 2017-08-05 MED ORDER — ONDANSETRON HCL 4 MG/2ML IJ SOLN
4.0000 mg | Freq: Once | INTRAMUSCULAR | Status: DC
  Filled 2017-08-05: qty 2

## 2017-08-05 MED ORDER — ACETAMINOPHEN 650 MG RE SUPP: 650.0000 mg | Freq: Four times a day (QID) | RECTAL | Status: DC | PRN

## 2017-08-05 MED ORDER — DEXTROSE-NACL 5-0.9 % IV SOLN
INTRAVENOUS | Status: DC
Start: 2017-08-05 — End: 2017-08-05
  Administered 2017-08-05 (×2): via INTRAVENOUS

## 2017-08-05 NOTE — Progress Notes (Deleted)
History and Physical    Lauren Atkinson WUJ:[id number] DOB: Apr 17, 1961 DOA: 08/05/2017  PCP: Gwenyth Bender, MD  Patient coming from: home  I have personally briefly reviewed patient's old medical records in [facility]  Chief Complaint: abdominal pain  HPI: Lauren Atkinson is a 56 y.o. female with medical history significant of possible acute intermittent porphyria. Patient is not from this area. This is her third presentation to the emergency room in the past 6 months. There is mention of a history of possible drug-seeking behavior. She reports onset of left-sided chest pain which radiated to her abdomen, beginning at approximately 4 AM this morning. She has associated nausea and vomiting. Denies any diarrhea . chest pain is associated with shortness of breath. She feels that her "heart was pounding". She's also had nausea and vomiting. No fever, cough, dysuria. She feels that her pain is uncontrolled. She presents to the emergency room where her lactic acid was noted to be mildly elevated at 2.9. Serum bicarbonate was mildly low at 19. She was noted to be somewhat dehydrated. She received IV narcotics without significant improvement of her pain. She's been referred for admission.  Review of Systems: As per HPI otherwise 10 point review of systems negative.    Past Medical History:  Diagnosis Date  . Acute porphyria (HCC)   . Anxiety   . Chronic abdominal pain   . Depression   . Dysrhythmia    a fib  . H/O myocardial ischemia    with cardiomyopathy and prolonged QT, per patient report  . Seizures (HCC)   . Stroke [facility])    "2, at least"    Past Surgical History:  Procedure Laterality Date  . abdominal patch       reports that she has been smoking Cigarettes.  She has been smoking about 1.00 pack per day. She has never used smokeless tobacco. She reports that she uses drugs, including Marijuana. She reports that she does not drink alcohol.  Allergies  Allergen Reactions    . Heparin     Can not have "high doses" of Heparin.  . Other     Anti-nausea medications due to prolonged QT interval per pt.   . Penicillins Hives    Has patient had a PCN reaction causing immediate rash, facial/tongue/throat swelling, SOB or lightheadedness with hypotension: No Has patient had a PCN reaction causing severe rash involving mucus membranes or skin necrosis: No Has patient had a PCN reaction that required hospitalization No Has patient had a PCN reaction occurring within the last 10 years: No If all of the above answers are "NO", then may proceed with Cephalosporin use.   . Toradol [Ketorolac Tromethamine] Diarrhea    Family history: Family history reviewed and not pertinent  Prior to Admission medications   Not on File    Physical Exam: Vitals:   08/05/17 1300 08/05/17 1330 08/05/17 1400 08/05/17 1512  BP: (!) 170/107 (!) 174/110 (!) 178/94 (!) 170/125  Pulse: 81 (!) 51  75  Resp:    18  Temp:    97.8 F (36.6 C)  TempSrc:    Oral  SpO2: 99% 96%  100%  Weight:    63.5 kg (140 lb)  Height:     (1.702 m)    Constitutional: NAD, calm, comfortable Vitals:   08/05/17 1300 08/05/17 1330 08/05/17 1400 08/05/17 1512  BP: (!) 170/107 (!) 174/110 (!) 178/94 (!) 170/125  Pulse: 81 (!) 51  75  Resp:  18  Temp:    97.8 F (36.6 C)  TempSrc:    Oral  SpO2: 99% 96%  100%  Weight:    63.5 kg (140 lb)  Height:     (1.702 m)   Eyes: PERRL, lids and conjunctivae normal ENMT: Mucous membranes are moist. Posterior pharynx clear of any exudate or lesions.Normal dentition.  Neck: normal, supple, no masses, no thyromegaly Respiratory: clear to auscultation bilaterally, no wheezing, no crackles. Normal respiratory effort. No accessory muscle use.  Cardiovascular: Regular rate and rhythm, no murmurs / rubs / gallops. No extremity edema. 2+ pedal pulses. No carotid bruits.  Abdomen: no tenderness, no masses palpated. No hepatosplenomegaly. Bowel sounds positive.   Musculoskeletal: no clubbing / cyanosis. No joint deformity upper and lower extremities. Good ROM, no contractures. Normal muscle tone.  Skin: no rashes, lesions, ulcers. No induration Neurologic: CN 2-12 grossly intact. Sensation intact, DTR normal. Strength 5/5 in all 4.  Psychiatric: very anxious, she feels she is "delirious"  Labs on Admission: I have personally reviewed following labs and imaging studies  CBC:  Recent Labs Lab 08/05/17 0756  WBC 15.8*  HGB 16.6*  HCT 48.2*  MCV 92.2  PLT 374   Basic Metabolic Panel:  Recent Labs Lab 08/05/17 0757 08/05/17 1035  NA 142 142  K 3.9 4.2  CL 106 110  CO2 23 19*  GLUCOSE 186* 168*  BUN 10 9  CREATININE 1.02* 0.89  CALCIUM 9.8 9.3   GFR: Estimated Creatinine Clearance: 70.3 mL/min (by C-G formula based on SCr of 0.89 mg/dL). Liver Function Tests:  Recent Labs Lab 08/05/17 0757  AST 21  ALT 12*  ALKPHOS 71  BILITOT 0.6  PROT 8.0  ALBUMIN 4.3    Recent Labs Lab 08/05/17 0757  LIPASE 38   No results for input(s): AMMONIA in the last 168 hours. Coagulation Profile: No results for input(s): INR, PROTIME in the last 168 hours. Cardiac Enzymes:  Recent Labs Lab 08/05/17 0757  TROPONINI <0.03   BNP (last 3 results) No results for input(s): PROBNP in the last 8760 hours. HbA1C: No results for input(s): HGBA1C in the last 72 hours. CBG: No results for input(s): GLUCAP in the last 168 hours. Lipid Profile: No results for input(s): CHOL, HDL, LDLCALC, TRIG, CHOLHDL, LDLDIRECT in the last 72 hours. Thyroid Function Tests: No results for input(s): TSH, T4TOTAL, FREET4, T3FREE, THYROIDAB in the last 72 hours. Anemia Panel: No results for input(s): VITAMINB12, FOLATE, FERRITIN, TIBC, IRON, RETICCTPCT in the last 72 hours. Urine analysis:    Component Value Date/Time   COLORURINE YELLOW 03/04/2017 0422   APPEARANCEUR CLEAR 03/04/2017 0422   LABSPEC >1.030 (H) 03/04/2017 0422   PHURINE 5.0 03/04/2017  0422   GLUCOSEU NEGATIVE 03/04/2017 0422   HGBUR TRACE (A) 03/04/2017 0422   BILIRUBINUR NEGATIVE 03/04/2017 0422   KETONESUR 15 (A) 03/04/2017 0422   PROTEINUR 30 (A) 03/04/2017 0422   UROBILINOGEN 1.0 02/17/2015 2038   NITRITE NEGATIVE 03/04/2017 0422   LEUKOCYTESUR NEGATIVE 03/04/2017 0422    Radiological Exams on Admission: Dg Chest 2 View  Result Date: 08/05/2017 CLINICAL DATA:  Woke up this morning with chest, nausea and vomiting. EXAM: CHEST  2 VIEW COMPARISON:  02/06/2017 FINDINGS: Right IJ Port-A-Cath unchanged with tip just below the cavoatrial junction. Lungs are adequately inflated without focal airspace consolidation, effusion or pneumothorax. Mild prominence of the perihilar markings without significant change. Cardiomediastinal silhouette is normal for prominence of the main pulmonary artery segment unchanged and likely due  to pulmonary arterial hypertension. Mild degenerate change of the spine. IMPRESSION: No acute cardiopulmonary disease. Stable prominence of the main pulmonary artery segment suggesting pulmonary arterial hypertension. Electronically Signed   By: Elberta Fortis M.D.   On: 08/05/2017 08:30    EKG: Independently reviewed. No acute changes, QT is not prolonged  Assessment/Plan Active Problems:   AIP (acute intermittent porphyria) (HCC)   Acute epigastric pain   Lactic acidosis    1. Acute abdominal pain, possibly acute intermittent porphyria. She previously had a random urine porphobilinogen that was mildly elevated at 2.2. Discuss case with Dr. Janyth Contes, who felt that this level was unimpressive. She did not recommend that patient receive hemin at this time. She'll be treated with dextrose containing IV fluids and encouraged a high carbohydrate diet. Will treat pain with morphine for now. Treat nausea and vomiting with antiemetics. QT on current EKG does not appear prolonged. If symptoms/labs do not improve by morning, can consider further imaging, although her  abdomen is benign on exam. 2. Dehydration and lactic acidosis. Possibly related to vomiting. Treat with IV fluids. Repeat labs in a.m .  DVT prophylaxis: scd Code Status: full code Family Communication: discussed with patient Disposition Plan: discharge home once improved Consults called:  Admission status: observation, Link Snuffer MD [facility] Pager [phone]  If 7PM-7AM, please contact night-coverage [email or web address] Password [facility]  08/05/2017, 4:29 PM

## 2017-08-05 NOTE — ED Triage Notes (Signed)
PT C/O CP that woke her up from sleep with N/V. Pt crying and yelling.

## 2017-08-05 NOTE — Progress Notes (Signed)
Pt resting comfortably at this time.  States she is still having pain, MD made aware, no new orders received at this time.  Pt has been more calm and less anxious since receiving pain medication.

## 2017-08-05 NOTE — ED Notes (Signed)
CRITICAL VALUE ALERT  Critical Value:  istat lactic acid 2.73  Date & Time Notied:  07/2017 0903  Provider Notified: Dr. Hyacinth Meeker  Orders Received/Actions taken: EDP notified, no further orders given

## 2017-08-05 NOTE — Plan of Care (Signed)
Problem: Pain Managment: Goal: General experience of comfort will improve Outcome: Not Progressing Pt c/o 9/10 pain in abdomen. Pt can have Morphine q4h PRN, given x1 this shift so fart. Pt yelling out in room stating she is hurting. Pt educated on pain mgmt as well as medications available. Pt assured that RN has notified MD of pain and nothing else can be given at this time. Pt verbalized understanding. Will continue to monitor pt

## 2017-08-05 NOTE — ED Notes (Signed)
Pharmacist reports Hemin is not available here or [facility], only available at [facility]. EDP aware.   Pt requesting Dilaudid, Morphine or Ativan for pain control. EDP notified. No further medication at this time. Pt notified.

## 2017-08-05 NOTE — Progress Notes (Addendum)
Dr. Clearence Ped paged to ask for safety sitter d/t pt getting OOB and threatening to rip IV out. As well as flight risk. New order placed for safety sitter. Will continue to monitor pt

## 2017-08-05 NOTE — H&P (Signed)
History and Physical    Lauren Atkinson ZOX:[id number] DOB: 1961-08-21 DOA: 08/05/2017  PCP: Gwenyth Bender, MD  Patient coming from: home  I have personally briefly reviewed patient's old medical records in [facility]  Chief Complaint: abdominal pain  HPI: Lauren Atkinson is a 56 y.o. female with medical history significant of possible acute intermittent porphyria. Patient is not from this area. This is her third presentation to the emergency room in the past 6 months. There is mention of a history of possible drug-seeking behavior. She reports onset of left-sided chest pain which radiated to her abdomen, beginning at approximately 4 AM this morning. She has associated nausea and vomiting. Denies any diarrhea . chest pain is associated with shortness of breath. She feels that her "heart was pounding". She's also had nausea and vomiting. No fever, cough, dysuria. She feels that her pain is uncontrolled. She presents to the emergency room where her lactic acid was noted to be mildly elevated at 2.9. Serum bicarbonate was mildly low at 19. She was noted to be somewhat dehydrated. She received IV narcotics without significant improvement of her pain. She's been referred for admission.  Review of Systems: As per HPI otherwise 10 point review of systems negative.    Past Medical History:  Diagnosis Date  . Acute porphyria (HCC)   . Anxiety   . Chronic abdominal pain   . Depression   . Dysrhythmia    a fib  . H/O myocardial ischemia    with cardiomyopathy and prolonged QT, per patient report  . Seizures (HCC)   . Stroke [facility])    "2, at least"    Past Surgical History:  Procedure Laterality Date  . abdominal patch       reports that she has been smoking Cigarettes.  She has been smoking about 1.00 pack per day. She has never used smokeless tobacco. She reports that she uses drugs, including Marijuana. She reports that she does not drink alcohol.  Allergies  Allergen Reactions    . Heparin     Can not have "high doses" of Heparin.  . Other     Anti-nausea medications due to prolonged QT interval per pt.   . Penicillins Hives    Has patient had a PCN reaction causing immediate rash, facial/tongue/throat swelling, SOB or lightheadedness with hypotension: No Has patient had a PCN reaction causing severe rash involving mucus membranes or skin necrosis: No Has patient had a PCN reaction that required hospitalization No Has patient had a PCN reaction occurring within the last 10 years: No If all of the above answers are "NO", then may proceed with Cephalosporin use.   . Toradol [Ketorolac Tromethamine] Diarrhea    Family history: Family history reviewed and not pertinent  Prior to Admission medications   Not on File    Physical Exam: Vitals:   08/05/17 1400 08/05/17 1512 08/05/17 1934 08/05/17 2015  BP: (!) 178/94 (!) 170/125  (!) 152/89  Pulse:  75  81  Resp:  18  18  Temp:  97.8 F (36.6 C)  98.6 F (37 C)  TempSrc:  Oral  Oral  SpO2:  100% 97% 100%  Weight:  63.5 kg (140 lb)    Height:   (1.702 m)      Constitutional: NAD, calm, comfortable Vitals:   08/05/17 1400 08/05/17 1512 08/05/17 1934 08/05/17 2015  BP: (!) 178/94 (!) 170/125  (!) 152/89  Pulse:  75  81  Resp:  18  18  Temp:  97.8 F (36.6 C)  98.6 F (37 C)  TempSrc:  Oral  Oral  SpO2:  100% 97% 100%  Weight:  63.5 kg (140 lb)    Height:   (1.702 m)     Eyes: PERRL, lids and conjunctivae normal ENMT: Mucous membranes are moist. Posterior pharynx clear of any exudate or lesions.Normal dentition.  Neck: normal, supple, no masses, no thyromegaly Respiratory: clear to auscultation bilaterally, no wheezing, no crackles. Normal respiratory effort. No accessory muscle use.  Cardiovascular: Regular rate and rhythm, no murmurs / rubs / gallops. No extremity edema. 2+ pedal pulses. No carotid bruits.  Abdomen: no tenderness, no masses palpated. No hepatosplenomegaly. Bowel sounds  positive.  Musculoskeletal: no clubbing / cyanosis. No joint deformity upper and lower extremities. Good ROM, no contractures. Normal muscle tone.  Skin: no rashes, lesions, ulcers. No induration Neurologic: CN 2-12 grossly intact. Sensation intact, DTR normal. Strength 5/5 in all 4.  Psychiatric: very anxious, she feels she is "delirious"  Labs on Admission: I have personally reviewed following labs and imaging studies  CBC:  Recent Labs Lab 08/05/17 0756  WBC 15.8*  HGB 16.6*  HCT 48.2*  MCV 92.2  PLT 374   Basic Metabolic Panel:  Recent Labs Lab 08/05/17 0757 08/05/17 1035  NA 142 142  K 3.9 4.2  CL 106 110  CO2 23 19*  GLUCOSE 186* 168*  BUN 10 9  CREATININE 1.02* 0.89  CALCIUM 9.8 9.3   GFR: Estimated Creatinine Clearance: 70.3 mL/min (by C-G formula based on SCr of 0.89 mg/dL). Liver Function Tests:  Recent Labs Lab 08/05/17 0757  AST 21  ALT 12*  ALKPHOS 71  BILITOT 0.6  PROT 8.0  ALBUMIN 4.3    Recent Labs Lab 08/05/17 0757  LIPASE 38   No results for input(s): AMMONIA in the last 168 hours. Coagulation Profile: No results for input(s): INR, PROTIME in the last 168 hours. Cardiac Enzymes:  Recent Labs Lab 08/05/17 0757  TROPONINI <0.03   BNP (last 3 results) No results for input(s): PROBNP in the last 8760 hours. HbA1C: No results for input(s): HGBA1C in the last 72 hours. CBG: No results for input(s): GLUCAP in the last 168 hours. Lipid Profile: No results for input(s): CHOL, HDL, LDLCALC, TRIG, CHOLHDL, LDLDIRECT in the last 72 hours. Thyroid Function Tests: No results for input(s): TSH, T4TOTAL, FREET4, T3FREE, THYROIDAB in the last 72 hours. Anemia Panel: No results for input(s): VITAMINB12, FOLATE, FERRITIN, TIBC, IRON, RETICCTPCT in the last 72 hours. Urine analysis:    Component Value Date/Time   COLORURINE YELLOW 03/04/2017 0422   APPEARANCEUR CLEAR 03/04/2017 0422   LABSPEC >1.030 (H) 03/04/2017 0422   PHURINE 5.0  03/04/2017 0422   GLUCOSEU NEGATIVE 03/04/2017 0422   HGBUR TRACE (A) 03/04/2017 0422   BILIRUBINUR NEGATIVE 03/04/2017 0422   KETONESUR 15 (A) 03/04/2017 0422   PROTEINUR 30 (A) 03/04/2017 0422   UROBILINOGEN 1.0 02/17/2015 2038   NITRITE NEGATIVE 03/04/2017 0422   LEUKOCYTESUR NEGATIVE 03/04/2017 0422    Radiological Exams on Admission: Dg Chest 2 View  Result Date: 08/05/2017 CLINICAL DATA:  Woke up this morning with chest, nausea and vomiting. EXAM: CHEST  2 VIEW COMPARISON:  02/06/2017 FINDINGS: Right IJ Port-A-Cath unchanged with tip just below the cavoatrial junction. Lungs are adequately inflated without focal airspace consolidation, effusion or pneumothorax. Mild prominence of the perihilar markings without significant change. Cardiomediastinal silhouette is normal for prominence of the main pulmonary artery segment unchanged  and likely due to pulmonary arterial hypertension. Mild degenerate change of the spine. IMPRESSION: No acute cardiopulmonary disease. Stable prominence of the main pulmonary artery segment suggesting pulmonary arterial hypertension. Electronically Signed   By: Elberta Fortis M.D.   On: 08/05/2017 08:30    EKG: Independently reviewed. No acute changes, QT is not prolonged  Assessment/Plan Active Problems:   AIP (acute intermittent porphyria) (HCC)   Acute epigastric pain   Lactic acidosis    1. Acute abdominal pain, possibly acute intermittent porphyria. She previously had a random urine porphobilinogen that was mildly elevated at 2.2. Discuss case with Dr. Janyth Contes, who felt that this level was unimpressive. She did not recommend that patient receive hemin at this time. She'll be treated with dextrose containing IV fluids and encouraged a high carbohydrate diet. Will treat pain with morphine for now. Treat nausea and vomiting with antiemetics. QT on current EKG does not appear prolonged. If symptoms/labs do not improve by morning, can consider further imaging,  although her abdomen is benign on exam. 2. Dehydration and lactic acidosis. Possibly related to vomiting. Treat with IV fluids. Repeat labs in a.m .  DVT prophylaxis: scd Code Status: full code Family Communication: discussed with patient Disposition Plan: discharge home once improved Consults called:  Admission status: observation, Link Snuffer MD [facility] Pager [phone]  If 7PM-7AM, please contact night-coverage [email or web address] Password [email or web address]  08/05/2017, 8:20 PM

## 2017-08-05 NOTE — ED Notes (Signed)
Pt requesting more pain medication at this time. EDP notified. Awaiting labs per EDP. Pt notified.

## 2017-08-05 NOTE — ED Provider Notes (Signed)
[facility]-EMERGENCY DEPT Provider Note   CSN: [id number] Arrival date & time: 08/05/17  1191     History   Chief Complaint Chief Complaint  Patient presents with  . Chest Pain    HPI Lauren Atkinson is a 56 y.o. female.  HPI  The pt is a 56 y/o female - she reports having a medical history that is significant for Porphyria - she has been evaluated for this and there has been no positive confirmatory testing - she has no documentation of this with regards to paperwork.  She has also stated that she has had a stroke and a couple of heart attacks and 7 seizures that occurred when she was at a hospital in [place] a number of years ago - states that she went onto hospice and then states that she was "kicked out" of hospice when she made a miraculous recovery.  She states that since that time she has intermittent flare ups of her abdominal pain.  This morning she reports having some upper chest pain that awoke her from sleep, was severe and constant and then transitioned into her epigastrium.  When describing the patient's chest pain she continues to state that it started in her heart and then moved into her abdomen. She states that she felt like her heart was beating hard and fast which was continuous and continues until now. Of note the patient's heart rate is 65 while she is complaining of this. The patient also reports that she becomes extremely diaphoretic when this kind of thing happens. She has not had any vomiting this morning, she has not had any diarrhea, she denies any swelling of the legs, denies fevers or chills, she has had a recent cough with a viral infection that she does endorse. At this time she states that her pain as coming in waves and it is severe and consistent with prior episodes of what she describes as her porphyria.  Past Medical History:  Diagnosis Date  . Acute porphyria (HCC)   . Anxiety   . Chronic abdominal pain   . Depression   . Dysrhythmia    a fib  . H/O  myocardial ischemia    with cardiomyopathy and prolonged QT, per patient report  . Seizures (HCC)   . Stroke [name])    "2, at least"    Patient Active Problem List   Diagnosis Date Noted  . Lactic acidosis 08/05/2017  . Abdominal pain 01/28/2017  . Hyperglycemia 01/28/2017  . Abnormal EKG 01/28/2017  . Generalized abdominal pain   . Intractable nausea and vomiting 02/18/2015  . 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 Chronic abdominal pain 02/18/2015  . Anxiety 02/18/2015  . Depression 02/18/2015  . Seizure (HCC) 02/18/2015  . CVA (cerebral infarction) 02/18/2015  . Atrial fibrillation (HCC) 02/18/2015  . Psoriasis 02/18/2015  . Protein-calorie malnutrition, severe (HCC) 02/18/2015  . Acute epigastric pain   . Porphyria (HCC)   . Prolonged QT interval   . Blood poisoning   . Leukocytosis     Past Surgical History:  Procedure Laterality Date  . abdominal patch      OB History    No data available       Home Medications    Prior to Admission medications   Not on File    Family History No family history on file.  Social History Social History  Substance Use Topics  . Smoking status: Current Every Day Smoker    Packs/day: 1.00  Types: Cigarettes  . Smokeless tobacco: Never Used  . Alcohol use No     Allergies   Heparin; Penicillins; and Toradol [ketorolac tromethamine]   Review of Systems Review of Systems  All other systems reviewed and are negative.   Physical Exam Updated Vital Signs BP (!) 160/102   Pulse 92   Temp 97.6 F (36.4 C) (Rectal)   Resp 18   Ht  (1.702 m)   Wt 63.5 kg (140 lb)   SpO2 100%   BMI 21.93 kg/m   Physical Exam  Constitutional: She appears well-developed and well-nourished.  The patient is screaming in the room that she has severe pain, this screaming stops and she is able to engage in discussion, soon as I leave the room she continues to yell  HENT:  Head: Normocephalic and atraumatic.    Mouth/Throat: Oropharynx is clear and moist. No oropharyngeal exudate.  Eyes: Pupils are equal, round, and reactive to light. Conjunctivae and EOM are normal. Right eye exhibits no discharge. Left eye exhibits no discharge. No scleral icterus.  Neck: Normal range of motion. Neck supple. No JVD present. No thyromegaly present.  Cardiovascular: Normal rate, regular rhythm, normal heart sounds and intact distal pulses.  Exam reveals no gallop and no friction rub.   No murmur heard. Heart rate of approximately 65 with no murmurs rubs or gallops, normal pulses at the radial arteries as well as the pedal pulses, no JVD.  Pulmonary/Chest: Effort normal and breath sounds normal. No respiratory distress. She has no wheezes. She has no rales.  Normal lung sounds, no rales wheezing or rhonchi, speech is clear and unlabored.  Abdominal: Soft. Bowel sounds are normal. She exhibits no distension and no mass. There is tenderness ( she has a very soft abdomen with no pain in the right upper quadrant or the lower abdomen, there is reproducible tenderness in the epigastrium but no peritoneal signs).  Musculoskeletal: Normal range of motion. She exhibits no edema or tenderness.  Lymphadenopathy:    She has no cervical adenopathy.  Neurological: She is alert. Coordination normal.  The patient is able to move all 4 extremities to command, she has normal strength,normal cranial nerves III through XII, clear speech and normal memory.  Skin: Skin is warm. No rash noted. She is diaphoretic. No erythema.  Psychiatric: She has a normal mood and affect. Her behavior is normal.  Nursing note and vitals reviewed.    ED Treatments / Results  Labs (all labs ordered are listed, but only abnormal results are displayed) Labs Reviewed  CBC - Abnormal; Notable for the following:       Result Value   WBC 15.8 (*)    RBC 5.23 (*)    Hemoglobin 16.6 (*)    HCT 48.2 (*)    All other components within normal limits   COMPREHENSIVE METABOLIC PANEL - Abnormal; Notable for the following:    Glucose, Bld 186 (*)    Creatinine, Ser 1.02 (*)    ALT 12 (*)    All other components within normal limits  BASIC METABOLIC PANEL - Abnormal; Notable for the following:    CO2 19 (*)    Glucose, Bld 168 (*)    All other components within normal limits  I-STAT CG4 LACTIC ACID, ED - Abnormal; Notable for the following:    Lactic Acid, Venous 2.73 (*)    All other components within normal limits  TROPONIN I  LIPASE, BLOOD  PORPHOBILINOGEN, RANDOM URINE  I-STAT CG4  LACTIC ACID, ED    EKG  EKG Interpretation  Date/Time:  Thursday August 05 2017 06:46:48 EDT Ventricular Rate:  66 PR Interval:    QRS Duration: 124 QT Interval:  435 QTC Calculation: 456 R Axis:   124 Text Interpretation:  Sinus rhythm Nonspecific intraventricular conduction delay Anteroseptal infarct, age indeterminate Baseline wander in lead(s) II III aVF No significant change was found Confirmed by Glynn Octave [phone]) on 08/05/2017 6:55:23 AM       Radiology Dg Chest 2 View  Result Date: 08/05/2017 CLINICAL DATA:  Woke up this morning with chest, nausea and vomiting. EXAM: CHEST  2 VIEW COMPARISON:  02/06/2017 FINDINGS: Right IJ Port-A-Cath unchanged with tip just below the cavoatrial junction. Lungs are adequately inflated without focal airspace consolidation, effusion or pneumothorax. Mild prominence of the perihilar markings without significant change. Cardiomediastinal silhouette is normal for prominence of the main pulmonary artery segment unchanged and likely due to pulmonary arterial hypertension. Mild degenerate change of the spine. IMPRESSION: No acute cardiopulmonary disease. Stable prominence of the main pulmonary artery segment suggesting pulmonary arterial hypertension. Electronically Signed   By: Elberta Fortis M.D.   On: 08/05/2017 08:30    Procedures Procedures (including critical care time)  Medications Ordered in  ED Medications  ondansetron (ZOFRAN) injection 4 mg (4 mg Intravenous Refused 08/05/17 0735)  dextrose 5 %-0.9 % sodium chloride infusion (not administered)  hemin (PANHEMATIN) injection 254.1 mg (not administered)  fentaNYL (SUBLIMAZE) injection 100 mcg (100 mcg Intravenous Given 08/05/17 0735)  HYDROmorphone (DILAUDID) injection 1 mg (1 mg Intravenous Given 08/05/17 0911)  sodium chloride 0.9 % bolus 1,000 mL (1,000 mLs Intravenous New Bag/Given 08/05/17 0908)     Initial Impression / Assessment and Plan / ED Course  I have reviewed the triage vital signs and the nursing notes.  Pertinent labs & imaging results that were available during my care of the patient were reviewed by me and considered in my medical decision making (see chart for details).    Though the patient does appear to be in significant amount of pain she has very little tenderness in her epigastrium.I question the diagnosis of porphyria however she had some testing done in 4/18 that showed that she had a slightly elevated Quantitative Porphobilinogen at 2.2 which had a "moderate relationship to Porphyria".   At this time the patient will get a single dose of nausea and pain medication. Labs have been ordered to further evaluate her abdominal and chest pain.  However it should be noted at this time that her EKG was totally nonischemic, she is not having an arrhythmia especially a tachycardia arrhythmia and now that she has no chest pain and it is primarily epigastric pain headaches a question of an exacerbation of her chronic pain. She has been noted to have drug seeking tendencies in the past. The patient will be treated with an initial dose of pain medicine as well as nausea medicine.  Possibly acute porphyria exacerbation, the patient continues to have significant discomfort and diaphoresis, nonsurgical abdomen labs reviewed, will admit to the hospitalist for further evaluation and treatment. The patient is requesting more pain  medicine frequently. She has been given both fentanyl and Dilaudid.  Discussed care with hospitalist Dr. Kerry Hough who will admit.  Final Clinical Impressions(s) / ED Diagnoses   Final diagnoses:  Abdominal pain, unspecified abdominal location    New Prescriptions New Prescriptions   No medications on file     Eber Hong, MD 08/05/17 1159

## 2017-08-05 NOTE — ED Notes (Signed)
Pt has visitors at this time, nephew and friend.

## 2017-08-05 NOTE — ED Notes (Signed)
Phlebotomy performed by RN, only able to obtain 1 blood collection tube needed for BMET, unable to get additional tube for istat lactic acid. EDP notified.

## 2017-08-05 NOTE — ED Notes (Signed)
Lawson Fiscal, Pharmacist notified that pt is being transferred to the floor for observation. Lawson Fiscal is contacting Hospitalist to see if Hemin is still needed.

## 2017-08-05 NOTE — Progress Notes (Signed)
Pt admitted to the floor, pt screaming and yelling uncontrollably.  Pt stated that she was in extreme pain and could not control herself.  Tried to comfort patient, MD was paged and made aware of situation.  MD did come to see patient orders for medication was received.

## 2017-08-06 ENCOUNTER — Observation Stay (HOSPITAL_COMMUNITY): Payer: Self-pay

## 2017-08-06 ENCOUNTER — Encounter (HOSPITAL_COMMUNITY): Payer: Self-pay

## 2017-08-06 LAB — CBC
HCT: 43.5 % (ref 36.0–46.0)
Hemoglobin: 14.7 g/dL (ref 12.0–15.0)
MCH: 30.8 pg (ref 26.0–34.0)
MCHC: 33.8 g/dL (ref 30.0–36.0)
MCV: 91.2 fL (ref 78.0–100.0)
PLATELETS: 336 10*3/uL (ref 150–400)
RBC: 4.77 MIL/uL (ref 3.87–5.11)
RDW: 13.6 % (ref 11.5–15.5)
WBC: 17.9 10*3/uL — AB (ref 4.0–10.5)

## 2017-08-06 LAB — BASIC METABOLIC PANEL
ANION GAP: 10 (ref 5–15)
BUN: 9 mg/dL (ref 6–20)
CALCIUM: 9 mg/dL (ref 8.9–10.3)
CO2: 26 mmol/L (ref 22–32)
Chloride: 103 mmol/L (ref 101–111)
Creatinine, Ser: 0.74 mg/dL (ref 0.44–1.00)
Glucose, Bld: 158 mg/dL — ABNORMAL HIGH (ref 65–99)
Potassium: 3.2 mmol/L — ABNORMAL LOW (ref 3.5–5.1)
Sodium: 139 mmol/L (ref 135–145)

## 2017-08-06 LAB — LACTIC ACID, PLASMA: Lactic Acid, Venous: 1.2 mmol/L (ref 0.5–1.9)

## 2017-08-06 MED ORDER — IOPAMIDOL (ISOVUE-300) INJECTION 61%
INTRAVENOUS | Status: AC
  Filled 2017-08-06: qty 30

## 2017-08-06 MED ORDER — IOPAMIDOL (ISOVUE-300) INJECTION 61%
100.0000 mL | Freq: Once | INTRAVENOUS | Status: AC | PRN
  Administered 2017-08-06: 100 mL via INTRAVENOUS

## 2017-08-06 MED ORDER — POTASSIUM CHLORIDE 10 MEQ/100ML IV SOLN
10.0000 meq | INTRAVENOUS | Status: AC
  Administered 2017-08-06 (×4): 10 meq via INTRAVENOUS
  Filled 2017-08-06 (×3): qty 100

## 2017-08-06 NOTE — Progress Notes (Signed)
Pt more calm this am.  Still has c/o abd pain but states they are not as severe today.  Pt cooperative and has no other complaints.

## 2017-08-06 NOTE — Progress Notes (Signed)
PROGRESS NOTE    Lauren Atkinson  WUJ:[id number] DOB: Nov 19, 1960 DOA: 08/05/2017 PCP: Gwenyth Bender, MD    Brief Narrative:  56 year old female with possible history of acute intermittent porphyria presents to the hospital with abdominal pain, dehydration, lactic acidosis and concerns for attack of acute intermittent porphyria. She was admitted for further evaluation.   Assessment & Plan:   Active Problems:   AIP (acute intermittent porphyria) (HCC)   Acute epigastric pain   Lactic acidosis   1. Acute abdominal pain, possibly acute intermittent porphyria. She previously had a random urine porphobilinogen that was mildly elevated at 2.2. Discussed case with Dr. Janyth Contes, who felt that this level was unimpressive. She did not recommend that patient receive hemin at this time. She'll be treated with dextrose containing IV fluids and encouraged a high carbohydrate diet. Repeat urine porphobilinogen level was repeated on admission and is in process Will treat pain with morphine for now. Treat nausea and vomiting with antiemetics. QT on current EKG does not appear prolonged. Since she has continued pain in WBC count has trended up. CT scan of the abdomen and pelvis has been ordered to rule out underlying pathology. 2. Dehydration and lactic acidosis. Possibly related to vomiting. Treat with IV fluids. Lactic acid has normalized.   DVT prophylaxis: SCDs Code Status: Full code Family Communication: No family present Disposition Plan: Discharge home in a.m. if WBC count is improving..   Consultants:     Procedures:     Antimicrobials:       Subjective: Continues to complain of abdominal pain. Not as bad as yesterday. Had nausea and vomiting overnight.  Objective: Vitals:   08/05/17 1934 08/05/17 2015 08/06/17 0640 08/06/17 1455  BP:  (!) 152/89 131/86 97/63  Pulse:  81 79 73  Resp:  Temp:  98.6 F (37 C) 98.5 F (36.9 C) 98.8 F (37.1 C)  TempSrc:  Oral Oral Oral    SpO2: 97% 100% 100% 99%  Weight:      Height:        Intake/Output Summary (Last 24 hours) at 08/06/17 1900 Last data filed at 08/06/17 1300  Gross per 24 hour  Intake           867.08 ml  Output                0 ml  Net           867.08 ml   Filed Weights   08/05/17 0643 08/05/17 1512  Weight: 63.5 kg (140 lb) 63.5 kg (140 lb)    Examination:  General exam: Appears calm and comfortable  Respiratory system: Clear to auscultation. Respiratory effort normal. Cardiovascular system: S1 & S2 heard, RRR. No JVD, murmurs, rubs, gallops or clicks. No pedal edema. Gastrointestinal system: Abdomen is nondistended, soft and diffusely tender. No organomegaly or masses felt. Normal bowel sounds heard. Central nervous system: Alert and oriented. No focal neurological deficits. Extremities: Symmetric 5 x 5 power. Skin: No rashes, lesions or ulcers Psychiatry: Appears anxious    Data Reviewed: I have personally reviewed following labs and imaging studies  CBC:  Recent Labs Lab 08/05/17 0756 08/06/17 0631  WBC 15.8* 17.9*  HGB 16.6* 14.7  HCT 48.2* 43.5  MCV 92.2 91.2  PLT 374 336   Basic Metabolic Panel:  Recent Labs Lab 08/05/17 0757 08/05/17 1035 08/06/17 0631  NA 142 142 139  K 3.9 4.2 3.2*  CL 106 110 103  CO2 23 19* 26  GLUCOSE 186* 168* 158*  BUN CREATININE 1.02* 0.89 0.74  CALCIUM 9.8 9.3 9.0   GFR: Estimated Creatinine Clearance: 78.2 mL/min (by C-G formula based on SCr of 0.74 mg/dL). Liver Function Tests:  Recent Labs Lab 08/05/17 0757  AST 21  ALT 12*  ALKPHOS 71  BILITOT 0.6  PROT 8.0  ALBUMIN 4.3    Recent Labs Lab 08/05/17 0757  LIPASE 38   No results for input(s): AMMONIA in the last 168 hours. Coagulation Profile: No results for input(s): INR, PROTIME in the last 168 hours. Cardiac Enzymes:  Recent Labs Lab 08/05/17 0757  TROPONINI <0.03   BNP (last 3 results) No results for input(s): PROBNP in the last 8760  hours. HbA1C: No results for input(s): HGBA1C in the last 72 hours. CBG: No results for input(s): GLUCAP in the last 168 hours. Lipid Profile: No results for input(s): CHOL, HDL, LDLCALC, TRIG, CHOLHDL, LDLDIRECT in the last 72 hours. Thyroid Function Tests: No results for input(s): TSH, T4TOTAL, FREET4, T3FREE, THYROIDAB in the last 72 hours. Anemia Panel: No results for input(s): VITAMINB12, FOLATE, FERRITIN, TIBC, IRON, RETICCTPCT in the last 72 hours. Sepsis Labs:  Recent Labs Lab 08/05/17 0821 08/06/17 0631  LATICACIDVEN 2.73* 1.2    No results found for this or any previous visit (from the past 240 hour(s)).       Radiology Studies: Dg Chest 2 View  Result Date: 08/05/2017 CLINICAL DATA:  Woke up this morning with chest, nausea and vomiting. EXAM: CHEST  2 VIEW COMPARISON:  02/06/2017 FINDINGS: Right IJ Port-A-Cath unchanged with tip just below the cavoatrial junction. Lungs are adequately inflated without focal airspace consolidation, effusion or pneumothorax. Mild prominence of the perihilar markings without significant change. Cardiomediastinal silhouette is normal for prominence of the main pulmonary artery segment unchanged and likely due to pulmonary arterial hypertension. Mild degenerate change of the spine. IMPRESSION: No acute cardiopulmonary disease. Stable prominence of the main pulmonary artery segment suggesting pulmonary arterial hypertension. Electronically Signed   By: Elberta Fortis M.D.   On: 08/05/2017 08:30   Ct Abdomen Pelvis W Contrast  Result Date: 08/06/2017 CLINICAL DATA:  Severe abdominal pain when porphyria flares up. EXAM: CT ABDOMEN AND PELVIS WITH CONTRAST TECHNIQUE: Multidetector CT imaging of the abdomen and pelvis was performed using the standard protocol following bolus administration of intravenous contrast. CONTRAST:  ISOVUE-300 IOPAMIDOL (ISOVUE-300) INJECTION 61% COMPARISON:  01/28/2017, 01/14/2017 and 02/18/2015 FINDINGS: Lower chest:  Within normal. Hepatobiliary: Within normal. Pancreas: Within normal. Spleen: Within normal. Adrenals/Urinary Tract: Adrenal glands are normal. Kidneys are normal in size hydronephrosis. There is a subcentimeter hypodensity over the mid pole cortex of the right kidney too small to characterize but likely a cyst unchanged. Ureters and bladder are within normal. Stomach/Bowel: Stomach and small bowel are within normal. Is normal. Colon is within normal. Vascular/Lymphatic: Mild calcified plaque over the abdominal aorta and iliac arteries. Optic. Reproductive: Within normal. Other: No free fluid or focal inflammatory change. Evidence of previous abdominal wall hernia repair. Musculoskeletal: Minimal degenerate change of the spine. Stable earlier avascular necrosis of the hips. IMPRESSION: No acute findings in the abdomen/pelvis. Subcentimeter right renal cortical hypodensity unchanged and too small to characterize, but likely a cyst. Postsurgical changes as described. 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 Aortic Atherosclerosis (ICD10-I70.0). Electronically Signed   By: Elberta Fortis M.D.   On: 08/06/2017 14:51        Scheduled Meds: . iopamidol  Continuous Infusions: . dextrose 5 % and 0.45 % NaCl with KCl 20 mEq/L 125 mL/hr at 08/06/17 0535     LOS: 0 days    Time spent:    MEMON,JEHANZEB, MD [facility] Pager [phone]  If 7PM-7AM, please contact night-coverage [email or web address] Password [email or web address] 08/06/2017, 7:00 PM

## 2017-08-06 NOTE — Progress Notes (Signed)
Per sitter pt stated, "I wish someone would just end me like a dog." RN asked pt if she was having suicidal thoughts and pt stated no. Will pass on to day RN.

## 2017-08-06 NOTE — Progress Notes (Signed)
Per Recruitment consultant; pt woke up and stated, "I feel better now." and went right back to sleep. Now pt up and throwing up again, only taking in ice chips. Continues to refuse nausea medication. Will continue to monitor pt

## 2017-08-06 NOTE — Progress Notes (Signed)
Pt keeps throwing up in room stating she is nauseated but continues to refuse Zofran. RN adv pt if she keeps throwing up then she will no longer get any more water or ice. Pt states she is only taking ice chips but even without food she throws up bile. Rn adv pt her emesis is not yellow bile color, but clear like water. RN also adv pt that if Morphine was the cause of her throwing up she doesn't need that pain medication either. Pt immediately stopped throwing up and laid back down in bed.

## 2017-08-06 NOTE — Progress Notes (Signed)
Pt states she is vomiting up all of her water and she is only taking sips. RN in room to give pain medication and pt threw up a moderate amt of emesis. Brown/clear looking from coke pt has been sipping on. RN offered nausea mediation Zofran, pt refused stating "It will kill me, because my QT wave will prolong." RN adv pt that per MD note, her EKG looked ok with no prolonged QT interval. Pt still refused, stating, "It doesn't do anything (more vulgar language) anyway." RN given Morphine, pt stated morphine is what makes her throw up. Will continue to monitor pt

## 2017-08-07 LAB — CBC WITH DIFFERENTIAL/PLATELET
Basophils Absolute: 0.1 10*3/uL (ref 0.0–0.1)
Basophils Relative: 1 %
EOS ABS: 0.2 10*3/uL (ref 0.0–0.7)
Eosinophils Relative: 2 %
HEMATOCRIT: 38.7 % (ref 36.0–46.0)
HEMOGLOBIN: 12.8 g/dL (ref 12.0–15.0)
LYMPHS ABS: 3.3 10*3/uL (ref 0.7–4.0)
Lymphocytes Relative: 34 %
MCH: 30.8 pg (ref 26.0–34.0)
MCHC: 33.1 g/dL (ref 30.0–36.0)
MCV: 93 fL (ref 78.0–100.0)
MONO ABS: 0.5 10*3/uL (ref 0.1–1.0)
MONOS PCT: 6 %
NEUTROS PCT: 59 %
Neutro Abs: 5.8 10*3/uL (ref 1.7–7.7)
Platelets: 201 10*3/uL (ref 150–400)
RBC: 4.16 MIL/uL (ref 3.87–5.11)
RDW: 13.7 % (ref 11.5–15.5)
WBC: 9.9 10*3/uL (ref 4.0–10.5)

## 2017-08-07 MED ORDER — HEPARIN SOD (PORK) LOCK FLUSH 100 UNIT/ML IV SOLN
500.0000 [IU] | INTRAVENOUS | Status: AC | PRN
  Administered 2017-08-07: 500 [IU]
  Filled 2017-08-07: qty 5

## 2017-08-07 NOTE — Discharge Summary (Signed)
Physician Discharge Summary  Lauren Atkinson ZOX:[id number] DOB: 1961/02/26 DOA: 08/05/2017  PCP: Gwenyth Bender, MD  Admit date: 08/05/2017 Discharge date: 08/07/2017  Admitted From: home Disposition:  home  Recommendations for Outpatient Follow-up:  1. Follow up with PCP in 1-2 weeks 2. Please obtain BMP/CBC in one week   Home Health: Equipment/Devices:  Discharge Condition: stable CODE STATUS: full code Diet recommendation: high carb diet  Brief/Interim Summary: 56 year old female with possible history of acute intermittent porphyria presents to the hospital with abdominal pain, dehydration, nausea and vomiting, lactic acidosis and concerns for attack of acute intermittent porphyria. She was admitted for further evaluation  Patient was started on dextrose infusion as well as intermittent morphine. Lactic acidosis resolved with hydration. CT abdomen did not show any acute findings. WBC count was elevated at 17K, but has since normalized. On the day of discharge, patient is feeling significantly improved. She is not having any further pain is not requiring any further pain medications. She no longer had nausea and vomiting and is able to tolerate a diet. She is requesting discharge home. She has been advised to establish care with pcp. Patient is very hesitant to see a pcp due to her perception of poor experiences in the past. After a discussion, she is willing to establish care with Dr. Willey Blade and will call his office next week for an appointment.  Discharge Diagnoses:  Active Problems:   AIP (acute intermittent porphyria) (HCC)   Acute epigastric pain   Lactic acidosis    Discharge Instructions  Discharge Instructions    Diet - low sodium heart healthy    Complete by:  As directed    Increase activity slowly    Complete by:  As directed      Allergies as of 08/07/2017      Reactions   Heparin    Can not have "high doses" of Heparin.   Other    Anti-nausea medications  due to prolonged QT interval per pt.    Penicillins Hives   Has patient had a PCN reaction causing immediate rash, facial/tongue/throat swelling, SOB or lightheadedness with hypotension: No Has patient had a PCN reaction causing severe rash involving mucus membranes or skin necrosis: No Has patient had a PCN reaction that required hospitalization No Has patient had a PCN reaction occurring within the last 10 years: No If all of the above answers are "NO", then may proceed with Cephalosporin use.   Toradol [ketorolac Tromethamine] Diarrhea      Medication List    You have not been prescribed any medications.          Discharge Care Instructions        Start     Ordered   08/07/17 0000  Increase activity slowly     08/07/17 1118   08/07/17 0000  Diet - low sodium heart healthy     08/07/17 1118      Allergies  Allergen Reactions  . Heparin     Can not have "high doses" of Heparin.  . Other     Anti-nausea medications due to prolonged QT interval per pt.   . Penicillins Hives    Has patient had a PCN reaction causing immediate rash, facial/tongue/throat swelling, SOB or lightheadedness with hypotension: No Has patient had a PCN reaction causing severe rash involving mucus membranes or skin necrosis: No Has patient had a PCN reaction that required hospitalization No Has patient had a PCN reaction occurring within the last 10 years:  No If all of the above answers are "NO", then may proceed with Cephalosporin use.   . Toradol [Ketorolac Tromethamine] Diarrhea    Consultations:     Procedures/Studies: Dg Chest 2 View  Result Date: 08/05/2017 CLINICAL DATA:  Woke up this morning with chest, nausea and vomiting. EXAM: CHEST  2 VIEW COMPARISON:  02/06/2017 FINDINGS: Right IJ Port-A-Cath unchanged with tip just below the cavoatrial junction. Lungs are adequately inflated without focal airspace consolidation, effusion or pneumothorax. Mild prominence of the perihilar  markings without significant change. Cardiomediastinal silhouette is normal for prominence of the main pulmonary artery segment unchanged and likely due to pulmonary arterial hypertension. Mild degenerate change of the spine. IMPRESSION: No acute cardiopulmonary disease. Stable prominence of the main pulmonary artery segment suggesting pulmonary arterial hypertension. Electronically Signed   By: Elberta Fortis M.D.   On: 08/05/2017 08:30   Ct Abdomen Pelvis W Contrast  Result Date: 08/06/2017 CLINICAL DATA:  Severe abdominal pain when porphyria flares up. EXAM: CT ABDOMEN AND PELVIS WITH CONTRAST TECHNIQUE: Multidetector CT imaging of the abdomen and pelvis was performed using the standard protocol following bolus administration of intravenous contrast. CONTRAST:  ISOVUE-300 IOPAMIDOL (ISOVUE-300) INJECTION 61% COMPARISON:  01/28/2017, 01/14/2017 and 02/18/2015 FINDINGS: Lower chest: Within normal. Hepatobiliary: Within normal. Pancreas: Within normal. Spleen: Within normal. Adrenals/Urinary Tract: Adrenal glands are normal. Kidneys are normal in size hydronephrosis. There is a subcentimeter hypodensity over the mid pole cortex of the right kidney too small to characterize but likely a cyst unchanged. Ureters and bladder are within normal. Stomach/Bowel: Stomach and small bowel are within normal. Is normal. Colon is within normal. Vascular/Lymphatic: Mild calcified plaque over the abdominal aorta and iliac arteries. Optic. Reproductive: Within normal. Other: No free fluid or focal inflammatory change. Evidence of previous abdominal wall hernia repair. Musculoskeletal: Minimal degenerate change of the spine. Stable earlier avascular necrosis of the hips. IMPRESSION: No acute findings in the abdomen/pelvis. Subcentimeter right renal cortical hypodensity unchanged and too small to characterize, but likely a cyst. Postsurgical changes as described. 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 Aortic  Atherosclerosis (ICD10-I70.0). Electronically Signed   By: Elberta Fortis M.D.   On: 08/06/2017 14:51       Subjective: Feeling better today. Abdominal pain is better. No nausea or vomiting.  Discharge Exam: Vitals:   08/07/17 0509 08/07/17 0732  BP: 119/78 104/72  Pulse: 75 67  Resp: 20 18  Temp: 98.4 F (36.9 C) 98.1 F (36.7 C)  SpO2: 100% 100%   Vitals:   08/06/17 1455 08/06/17 2244 08/07/17 0509 08/07/17 0732  BP: 97/63 (!) 122/92 119/78 104/72  Pulse: 73 74 75 67  Resp: Temp: 98.8 F (37.1 C) 98.5 F (36.9 C) 98.4 F (36.9 C) 98.1 F (36.7 C)  TempSrc: Oral Oral Oral Oral  SpO2: 99% 99% 100% 100%  Weight:      Height:        General: Pt is alert, awake, not in acute distress Cardiovascular: RRR, S1/S2 +, no rubs, no gallops Respiratory: CTA bilaterally, no wheezing, no rhonchi Abdominal: Soft, NT, ND, bowel sounds + Extremities: no edema, no cyanosis    The results of significant diagnostics from this hospitalization (including imaging, microbiology, ancillary and laboratory) are listed below for reference.     Microbiology: No results found for this or any previous visit (from the past 240 hour(s)).   Labs: BNP (last 3 results) No results for input(s): BNP in the last 8760  hours. Basic Metabolic Panel:  Recent Labs Lab 08/05/17 0757 08/05/17 1035 08/06/17 0631  NA 142 142 139  K 3.9 4.2 3.2*  CL 106 110 103  CO2 23 19* 26  GLUCOSE 186* 168* 158*  BUN CREATININE 1.02* 0.89 0.74  CALCIUM 9.8 9.3 9.0   Liver Function Tests:  Recent Labs Lab 08/05/17 0757  AST 21  ALT 12*  ALKPHOS 71  BILITOT 0.6  PROT 8.0  ALBUMIN 4.3    Recent Labs Lab 08/05/17 0757  LIPASE 38   No results for input(s): AMMONIA in the last 168 hours. CBC:  Recent Labs Lab 08/05/17 0756 08/06/17 0631 08/07/17 0638  WBC 15.8* 17.9* 9.9  NEUTROABS  --   --  5.8  HGB 16.6* 14.7 12.8  HCT 48.2* 43.5 38.7  MCV 92.2 91.2 93.0  PLT  374 336 201   Cardiac Enzymes:  Recent Labs Lab 08/05/17 0757  TROPONINI <0.03   BNP: Invalid input(s): POCBNP CBG: No results for input(s): GLUCAP in the last 168 hours. D-Dimer No results for input(s): DDIMER in the last 72 hours. Hgb A1c No results for input(s): HGBA1C in the last 72 hours. Lipid Profile No results for input(s): CHOL, HDL, LDLCALC, TRIG, CHOLHDL, LDLDIRECT in the last 72 hours. Thyroid function studies No results for input(s): TSH, T4TOTAL, T3FREE, THYROIDAB in the last 72 hours.  Invalid input(s): FREET3 Anemia work up No results for input(s): VITAMINB12, FOLATE, FERRITIN, TIBC, IRON, RETICCTPCT in the last 72 hours. Urinalysis    Component Value Date/Time   COLORURINE YELLOW 03/04/2017 0422   APPEARANCEUR CLEAR 03/04/2017 0422   LABSPEC >1.030 (H) 03/04/2017 0422   PHURINE 5.0 03/04/2017 0422   GLUCOSEU NEGATIVE 03/04/2017 0422   HGBUR TRACE (A) 03/04/2017 0422   BILIRUBINUR NEGATIVE 03/04/2017 0422   KETONESUR 15 (A) 03/04/2017 0422   PROTEINUR 30 (A) 03/04/2017 0422   UROBILINOGEN 1.0 02/17/2015 2038   NITRITE NEGATIVE 03/04/2017 0422   LEUKOCYTESUR NEGATIVE 03/04/2017 0422   Sepsis Labs Invalid input(s): PROCALCITONIN,  WBC,  LACTICIDVEN Microbiology No results found for this or any previous visit (from the past 240 hour(s)).   Time coordinating discharge: Over 30 minutes  SIGNED:   Erick Blinks, MD  [facility] 08/07/2017, 11:18 AM Pager   If 7PM-7AM, please contact night-coverage [email or web address] Password [email or web address]

## 2017-08-07 NOTE — Progress Notes (Signed)
Pt. Given discharge instructions, educated and understood. Chest port deaccessed and heparin locked. Family member present to transport home.

## 2017-08-09 LAB — PORPHOBILINOGEN, RANDOM URINE: Quantitative Porphobilinogen: 0.7 mg/L (ref 0.0–2.0)

## 2017-08-27 ENCOUNTER — Emergency Department (HOSPITAL_COMMUNITY)
Admission: EM | Admit: 2017-08-27 | Discharge: 2017-08-27 | Disposition: A | Discharge status: Home / Self Care | Payer: Self-pay | Attending: Emergency Medicine | Admitting: Emergency Medicine

## 2017-08-27 ENCOUNTER — Encounter (HOSPITAL_COMMUNITY): Payer: Self-pay | Admitting: Emergency Medicine

## 2017-08-27 DIAGNOSIS — F1190 Opioid use, unspecified, uncomplicated: Secondary | ICD-10-CM | POA: Insufficient documentation

## 2017-08-27 DIAGNOSIS — R1013 Epigastric pain: Secondary | ICD-10-CM | POA: Insufficient documentation

## 2017-08-27 DIAGNOSIS — R109 Unspecified abdominal pain: Secondary | ICD-10-CM

## 2017-08-27 DIAGNOSIS — F329 Major depressive disorder, single episode, unspecified: Secondary | ICD-10-CM | POA: Insufficient documentation

## 2017-08-27 DIAGNOSIS — Z8673 Personal history of transient ischemic attack (TIA), and cerebral infarction without residual deficits: Secondary | ICD-10-CM | POA: Insufficient documentation

## 2017-08-27 DIAGNOSIS — F17210 Nicotine dependence, cigarettes, uncomplicated: Secondary | ICD-10-CM | POA: Insufficient documentation

## 2017-08-27 DIAGNOSIS — G8929 Other chronic pain: Secondary | ICD-10-CM | POA: Insufficient documentation

## 2017-08-27 DIAGNOSIS — F419 Anxiety disorder, unspecified: Secondary | ICD-10-CM | POA: Insufficient documentation

## 2017-08-27 DIAGNOSIS — F1290 Cannabis use, unspecified, uncomplicated: Secondary | ICD-10-CM | POA: Insufficient documentation

## 2017-08-27 LAB — COMPREHENSIVE METABOLIC PANEL
ALBUMIN: 4.4 g/dL (ref 3.5–5.0)
ALT: 10 U/L — ABNORMAL LOW (ref 14–54)
AST: 14 U/L — AB (ref 15–41)
Alkaline Phosphatase: 79 U/L (ref 38–126)
Anion gap: 15 (ref 5–15)
BILIRUBIN TOTAL: 0.6 mg/dL (ref 0.3–1.2)
BUN: 11 mg/dL (ref 6–20)
CO2: 19 mmol/L — ABNORMAL LOW (ref 22–32)
Calcium: 9.8 mg/dL (ref 8.9–10.3)
Chloride: 111 mmol/L (ref 101–111)
Creatinine, Ser: 0.89 mg/dL (ref 0.44–1.00)
GFR calc Af Amer: >60 mL/min (ref >60)
GFR calc non Af Amer: >60 mL/min (ref >60)
GLUCOSE: 189 mg/dL — AB (ref 65–99)
POTASSIUM: 4 mmol/L (ref 3.5–5.1)
Sodium: 145 mmol/L (ref 135–145)
TOTAL PROTEIN: 8.3 g/dL — AB (ref 6.5–8.1)

## 2017-08-27 LAB — CBC
HEMATOCRIT: 46.6 % — AB (ref 36.0–46.0)
Hemoglobin: 15.6 g/dL — ABNORMAL HIGH (ref 12.0–15.0)
MCH: 31 pg (ref 26.0–34.0)
MCHC: 33.5 g/dL (ref 30.0–36.0)
MCV: 92.6 fL (ref 78.0–100.0)
PLATELETS: 381 10*3/uL (ref 150–400)
RBC: 5.03 MIL/uL (ref 3.87–5.11)
RDW: 13.7 % (ref 11.5–15.5)
WBC: 20.2 10*3/uL — ABNORMAL HIGH (ref 4.0–10.5)

## 2017-08-27 LAB — URINALYSIS, ROUTINE W REFLEX MICROSCOPIC
Glucose, UA: NEGATIVE mg/dL
KETONES UR: 15 mg/dL — AB
Leukocytes, UA: NEGATIVE
NITRITE: NEGATIVE
PROTEIN: 100 mg/dL — AB
Specific Gravity, Urine: >1.03 — ABNORMAL HIGH (ref 1.005–1.030)
pH: 5.5 (ref 5.0–8.0)

## 2017-08-27 LAB — RAPID URINE DRUG SCREEN, HOSP PERFORMED
Amphetamines: NOT DETECTED
Barbiturates: NOT DETECTED
Benzodiazepines: NOT DETECTED
COCAINE: NOT DETECTED
OPIATES: POSITIVE — AB
Tetrahydrocannabinol: POSITIVE — AB

## 2017-08-27 LAB — URINALYSIS, MICROSCOPIC (REFLEX)

## 2017-08-27 LAB — LIPASE, BLOOD: LIPASE: 30 U/L (ref 11–51)

## 2017-08-27 MED ORDER — DICYCLOMINE HCL 20 MG PO TABS: 20.0000 mg | ORAL_TABLET | Freq: Two times a day (BID) | ORAL | 0 refills | Status: DC | PRN

## 2017-08-27 MED ORDER — GI COCKTAIL ~~LOC~~
30.0000 mL | Freq: Once | ORAL | Status: AC
  Administered 2017-08-27: 30 mL via ORAL
  Filled 2017-08-27: qty 30

## 2017-08-27 MED ORDER — LORAZEPAM 2 MG/ML IJ SOLN
0.5000 mg | Freq: Once | INTRAMUSCULAR | Status: AC
  Administered 2017-08-27: 0.5 mg via INTRAVENOUS
  Filled 2017-08-27: qty 1

## 2017-08-27 MED ORDER — HALOPERIDOL LACTATE 5 MG/ML IJ SOLN
5.0000 mg | Freq: Once | INTRAMUSCULAR | Status: AC
  Administered 2017-08-27: 5 mg via INTRAVENOUS
  Filled 2017-08-27: qty 1

## 2017-08-27 MED ORDER — SODIUM CHLORIDE 0.9 % IV BOLUS (SEPSIS)
1000.0000 mL | Freq: Once | INTRAVENOUS | Status: AC
  Administered 2017-08-27: 1000 mL via INTRAVENOUS

## 2017-08-27 MED ORDER — ONDANSETRON HCL 4 MG PO TABS: 4.0000 mg | ORAL_TABLET | Freq: Three times a day (TID) | ORAL | 0 refills | Status: DC | PRN

## 2017-08-27 MED ORDER — HEPARIN SOD (PORK) LOCK FLUSH 100 UNIT/ML IV SOLN
INTRAVENOUS | Status: AC
  Administered 2017-08-27: 5 [IU]
  Filled 2017-08-27: qty 5

## 2017-08-27 MED ORDER — ONDANSETRON HCL 4 MG/2ML IJ SOLN
4.0000 mg | Freq: Once | INTRAMUSCULAR | Status: AC
  Administered 2017-08-27: 4 mg via INTRAVENOUS
  Filled 2017-08-27: qty 2

## 2017-08-27 NOTE — ED Notes (Signed)
Pt out of bed to bathroom  Ambulates erect and ambulates with a smooth relaxed gait pushing the pump

## 2017-08-27 NOTE — ED Notes (Signed)
This RN went to check on pt, pt found lying on sidewalk outside of ED lobby. Told this pt that she cannot lay outside and must stay inside the department if she would still like to be seen. Pt becoming verbally aggressive with this RN stating "This is the only place I'm comfortable, I need to lay down.". Notified pt she is next to be seen. Consulting civil engineer and security notified of pt's aggressive behavior. Pt lying on ED lobby floor refusing to get into a chair.

## 2017-08-27 NOTE — ED Notes (Signed)
Pt pushed wick to floor and urinated upon her bed  Her bed is changed while she cries without tears loudly demanding pain meds  Physician has seen; awaiting med orders

## 2017-08-27 NOTE — ED Notes (Signed)
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

## 2017-08-27 NOTE — ED Notes (Signed)
Upon discharge, pt states upon asking to sign DC as well as VS. "I don't know why you're asking me to do anything, You didn't do anything for me"  DC'd, declined wheelchair to exit   Ambulated hhle to toe with even easy gait erect with friend

## 2017-08-27 NOTE — ED Notes (Signed)
Pt states "I am not in withdrawal"

## 2017-08-27 NOTE — ED Provider Notes (Signed)
[facility]-EMERGENCY DEPT Provider Note   CSN: [id number] Arrival date & time: 08/27/17  1258     History   Chief Complaint Chief Complaint  Patient presents with  . Abdominal Pain    HPI Lauren Atkinson is a 56 y.o. female.  HPI Note initiated in error. Please see note from 08/27/17 Past Medical History:  Diagnosis Date  . Acute porphyria (HCC)   . Anxiety   . Chronic abdominal pain   . Depression   . Dysrhythmia    a fib  . H/O myocardial ischemia    with cardiomyopathy and prolonged QT, per patient report  . Seizures (HCC)   . Stroke [facility])    "2, at least"    Patient Active Problem List   Diagnosis Date Noted  . Lactic acidosis 08/05/2017  . Abdominal pain 01/28/2017  . Hyperglycemia 01/28/2017  . Abnormal EKG 01/28/2017  . Generalized abdominal pain   . Intractable nausea and vomiting 02/18/2015  . 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 Chronic abdominal pain 02/18/2015  . Anxiety 02/18/2015  . Depression 02/18/2015  . Seizure (HCC) 02/18/2015  . CVA (cerebral infarction) 02/18/2015  . Atrial fibrillation (HCC) 02/18/2015  . Psoriasis 02/18/2015  . Protein-calorie malnutrition, severe (HCC) 02/18/2015  . Acute epigastric pain   . Porphyria (HCC)   . Prolonged QT interval   . Blood poisoning   . Leukocytosis     Past Surgical History:  Procedure Laterality Date  . abdominal patch      OB History    No data available       Home Medications    Prior to Admission medications   Not on File    Family History History reviewed. No pertinent family history.  Social History Social History  Substance Use Topics  . Smoking status: Current Every Day Smoker    Packs/day: 1.00    Types: Cigarettes  . Smokeless tobacco: Never Used  . Alcohol use No     Allergies   Heparin; Other; Penicillins; and Toradol [ketorolac tromethamine]   Review of Systems Review of Systems   Physical Exam Updated Vital Signs BP (!) 157/116  (BP Location: Left Arm) Comment: Pt activly vomiting  Pulse 87   Temp 97.7 F (36.5 C) (Oral)   Resp 20   Ht  (1.702 m)   Wt 63.5 kg (140 lb)   SpO2 100%   BMI 21.93 kg/m   Physical Exam   ED Treatments / Results  Labs (all labs ordered are listed, but only abnormal results are displayed) Labs Reviewed  CBC - Abnormal; Notable for the following:       Result Value   WBC 20.2 (*)    Hemoglobin 15.6 (*)    HCT 46.6 (*)    All other components within normal limits  LIPASE, BLOOD  COMPREHENSIVE METABOLIC PANEL  URINALYSIS, ROUTINE W REFLEX MICROSCOPIC  RAPID URINE DRUG SCREEN, HOSP PERFORMED    EKG  EKG Interpretation  Date/Time:  Friday August 27 2017 13:23:24 EDT Ventricular Rate:  59 PR Interval:  162 QRS Duration: 88 QT Interval:  448 QTC Calculation: 443 R Axis:   127 Text Interpretation:  Sinus bradycardia with sinus arrhythmia Right axis deviation Pulmonary disease pattern Abnormal ECG Confirmed by Loren Racer ([id number]) on 08/27/2017 3:22:39 PM       Radiology No results found.  Procedures Procedures (including critical care time)  Medications Ordered in ED Medications  sodium chloride 0.9 % bolus 1,000 mL (not administered)  gi cocktail (Maalox,Lidocaine,Donnatal) (not administered)  ondansetron (ZOFRAN) injection 4 mg (not administered)     Initial Impression / Assessment and Plan / ED Course  I have reviewed the triage vital signs and the nursing notes.  Pertinent labs & imaging results that were available during my care of the patient were reviewed by me and considered in my medical decision making (see chart for details).       Final Clinical Impressions(s) / ED Diagnoses   Final diagnoses:  None    New Prescriptions New Prescriptions   No medications on file     Loren Racer, MD 08/29/17 [phone]

## 2017-08-27 NOTE — ED Notes (Signed)
Pt reports recent admission to [facility] , states she has been seen previously at medical centers at [facility] and [place] per her reports and she stopped going due to them wanting to send her to a pain clinic  She is rocking back and forth having been on the sidewalk and floor of waiting area yelling that she needs pain meds of morphine and ativan  Port a cath is accessed by Angelica Chessman, Charity fundraiser, CN while friend and this nurse distract her from contaminating the sterile field  She states she was given no physician discharge referrals upon her admission to the hospital here

## 2017-08-27 NOTE — ED Notes (Signed)
Dr Ranae Palms in to reassess and discuss findings

## 2017-08-27 NOTE — ED Notes (Signed)
Pure wick in place 

## 2017-08-27 NOTE — ED Notes (Signed)
Pt initially refused to allow flush with heparin, then stated that she had her port flushed with heparin. Just not it she had to have large doses of heparin  Port flushed with heparin, IV removed and ressing applied

## 2017-08-27 NOTE — ED Notes (Signed)
Ice chips provided.

## 2017-08-27 NOTE — ED Notes (Signed)
Out of bed to bathroom 

## 2017-08-27 NOTE — ED Notes (Signed)
Dr Yelverton in to assess 

## 2017-08-27 NOTE — ED Notes (Signed)
Pt crying without tears, reports she is in agony,   Nurse noted pt puttling finger down throat to vomit- pt told to cease her behaviors with education that that behavior many times is called purging and is not a healthy behavior

## 2017-08-27 NOTE — ED Notes (Signed)
Dr Ranae Palms apprised that pt would like to speak w him

## 2017-08-27 NOTE — ED Notes (Signed)
Pt laying outside on sidewalk at this time. Security called to escort pt back into waiting area.

## 2017-08-27 NOTE — ED Provider Notes (Signed)
[facility]PT Provider Note   CSN: [id number] Arrival date & time: 08/27/17  1258     History   Chief Complaint Chief Complaint  Patient presents with  . Abdominal Pain    HPI Lauren Atkinson is a 56 y.o. female.  HPI Patient history of chronic abdominal pain and question history of porphyria presents with abdominal pain that worsened after having a bowel movement this morning. States that she's had nausea and vomiting. Denies any urinary symptoms. No fever but does endorse chills. Requesting pain medication.    Past Medical History:  Diagnosis Date  . Acute porphyria (HCC)   . Anxiety   . Chronic abdominal pain   . Depression   . Dysrhythmia    a fib  . H/O myocardial ischemia    with cardiomyopathy and prolonged QT, per patient report  . Seizures (HCC)   . Stroke [facility])    "2, at least"    Patient Active Problem List   Diagnosis Date Noted  . Lactic acidosis 08/05/2017  . Abdominal pain 01/28/2017  . Hyperglycemia 01/28/2017  . Abnormal EKG 01/28/2017  . Generalized abdominal pain   . Intractable nausea and vomiting 02/18/2015  . 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 Chronic abdominal pain 02/18/2015  . Anxiety 02/18/2015  . Depression 02/18/2015  . Seizure (HCC) 02/18/2015  . CVA (cerebral infarction) 02/18/2015  . Atrial fibrillation (HCC) 02/18/2015  . Psoriasis 02/18/2015  . Protein-calorie malnutrition, severe (HCC) 02/18/2015  . Acute epigastric pain   . Porphyria (HCC)   . Prolonged QT interval   . Blood poisoning   . Leukocytosis     Past Surgical History:  Procedure Laterality Date  . abdominal patch      OB History    No data available       Home Medications    Prior to Admission medications   Medication Sig Start Date End Date Taking? Authorizing Provider  dicyclomine (BENTYL) 20 MG tablet Take 1 tablet (20 mg total) by mouth 2 (two) times daily as needed for spasms. 08/27/17   Loren Racer, MD    ondansetron (ZOFRAN) 4 MG tablet Take 1 tablet (4 mg total) by mouth every 8 (eight) hours as needed for nausea or vomiting. 08/27/17   Loren Racer, MD    Family History History reviewed. No pertinent family history.  Social History Social History  Substance Use Topics  . Smoking status: Current Every Day Smoker    Packs/day: 1.00    Types: Cigarettes  . Smokeless tobacco: Never Used  . Alcohol use No     Allergies   Heparin; Other; Penicillins; and Toradol [ketorolac tromethamine]   Review of Systems Review of Systems  Constitutional: Negative for chills and fever.  Respiratory: Negative for cough and shortness of breath.   Cardiovascular: Negative for chest pain.  Gastrointestinal: Positive for abdominal pain, nausea and vomiting. Negative for blood in stool, constipation and diarrhea.  Genitourinary: Negative for dysuria, frequency and hematuria.  Musculoskeletal: Negative for back pain, neck pain and neck stiffness.  Skin: Negative for rash and wound.  Neurological: Negative for dizziness, weakness, light-headedness, numbness and headaches.  All other systems reviewed and are negative.    Physical Exam Updated Vital Signs BP (!) 171/107   Pulse (!) 103   Temp 97.7 F (36.5 C) (Oral)   Resp 20   Ht  (1.702 m)   Wt 63.5 kg (140 lb)   SpO2 98%   BMI 21.93 kg/m  Physical Exam  Constitutional: She is oriented to person, place, and time. She appears well-developed and well-nourished.  Patient is resting comfortably and sleeping as I enter the room. She becomes more agitated frequently screaming out. She is able to be redirected. Patient is not in a gown. She is naked under her bed sheet.  HENT:  Head: Normocephalic and atraumatic.  Mouth/Throat: Oropharynx is clear and moist. No oropharyngeal exudate.  Eyes: Pupils are equal, round, and reactive to light. EOM are normal.  Neck: Normal range of motion. Neck supple.  Cardiovascular: Normal rate and  regular rhythm.  Exam reveals no gallop and no friction rub.   No murmur heard. Pulmonary/Chest: Effort normal and breath sounds normal. No respiratory distress. She has no wheezes. She has no rales. She exhibits no tenderness.  Abdominal: Soft. Bowel sounds are normal. There is tenderness. There is no rebound and no guarding.  Mild epigastric tenderness to palpation without rebound or guarding.  Musculoskeletal: Normal range of motion. She exhibits no edema or tenderness.  Neurological: She is alert and oriented to person, place, and time.  Moving all extremities without focal deficit. Sensation intact.  Skin: Skin is warm and dry. No rash noted. No erythema.  Psychiatric: She has a normal mood and affect. Her behavior is normal.  Very bizarre behavior. Does not appear to be in acute pain until I'm in the room. Question drug-seeking behavior.  Nursing note and vitals reviewed.    ED Treatments / Results  Labs (all labs ordered are listed, but only abnormal results are displayed) Labs Reviewed  COMPREHENSIVE METABOLIC PANEL - Abnormal; Notable for the following:       Result Value   CO2 19 (*)    Glucose, Bld 189 (*)    Total Protein 8.3 (*)    AST 14 (*)    ALT 10 (*)    All other components within normal limits  CBC - Abnormal; Notable for the following:    WBC 20.2 (*)    Hemoglobin 15.6 (*)    HCT 46.6 (*)    All other components within normal limits  URINALYSIS, ROUTINE W REFLEX MICROSCOPIC - Abnormal; Notable for the following:    APPearance HAZY (*)    Specific Gravity, Urine >1.030 (*)    Hgb urine dipstick LARGE (*)    Bilirubin Urine SMALL (*)    Ketones, ur 15 (*)    Protein, ur 100 (*)    All other components within normal limits  RAPID URINE DRUG SCREEN, HOSP PERFORMED - Abnormal; Notable for the following:    Opiates POSITIVE (*)    Tetrahydrocannabinol POSITIVE (*)    All other components within normal limits  URINALYSIS, MICROSCOPIC (REFLEX) - Abnormal;  Notable for the following:    Bacteria, UA FEW (*)    Squamous Epithelial / LPF 0-5 (*)    All other components within normal limits  LIPASE, BLOOD    EKG  EKG Interpretation  Date/Time:  Friday August 27 2017 13:23:24 EDT Ventricular Rate:  59 PR Interval:  162 QRS Duration: 88 QT Interval:  448 QTC Calculation: 443 R Axis:   127 Text Interpretation:  Sinus bradycardia with sinus arrhythmia Right axis deviation Pulmonary disease pattern Abnormal ECG Confirmed by Loren Racer ([id number]) on 08/27/2017 3:22:39 PM       Radiology No results found.  Procedures Procedures (including critical care time)  Medications Ordered in ED Medications  sodium chloride 0.9 % bolus 1,000 mL (1,000 mLs Intravenous New Bag/Given  08/27/17 1532)  gi cocktail (Maalox,Lidocaine,Donnatal) (30 mLs Oral Given 08/27/17 1532)  ondansetron (ZOFRAN) injection 4 mg (4 mg Intravenous Given 08/27/17 1533)  sodium chloride 0.9 % bolus 1,000 mL (1,000 mLs Intravenous New Bag/Given 08/27/17 1624)  haloperidol lactate (HALDOL) injection 5 mg (5 mg Intravenous Given 08/27/17 1636)  LORazepam (ATIVAN) injection 0.5 mg (0.5 mg Intravenous Given 08/27/17 1635)     Initial Impression / Assessment and Plan / ED Course  I have reviewed the triage vital signs and the nursing notes.  Pertinent labs & imaging results that were available during my care of the patient were reviewed by me and considered in my medical decision making (see chart for details).     Workup significant for leukocytosis, UDS positive for opiates and marijuana. Urine appears concentrated likely from mild dehydration. Abdominal exam is benign. Patient reported improvement with Haldol and Ativan. She is requesting more medication. Informed her she needs to follow-up with her primary physician and possible referral for chronic pain specialist. Return precautions given.  Final Clinical Impressions(s) / ED Diagnoses   Final diagnoses:  Chronic  abdominal pain    New Prescriptions New Prescriptions   DICYCLOMINE (BENTYL) 20 MG TABLET    Take 1 tablet (20 mg total) by mouth 2 (two) times daily as needed for spasms.   ONDANSETRON (ZOFRAN) 4 MG TABLET    Take 1 tablet (4 mg total) by mouth every 8 (eight) hours as needed for nausea or vomiting.     Loren Racer, MD 08/27/17 1910

## 2017-08-27 NOTE — ED Triage Notes (Signed)
Pt states she has chronic abd pain from a metabolic disorder, feels same as previous "attacks". Has been V/ all morning, c/o epigastric pain. Pt is crying and yelling in triage. Used marijuana today with some relief.

## 2017-08-27 NOTE — ED Notes (Signed)
Pt friend came into room and pt began moaning - stopped during discharge instructions  Reports prescriptions "are just crap"   Pt encouraged to contact chronic pain clinics as listed

## 2017-08-27 NOTE — ED Notes (Signed)
Dr Ranae Palms is apprised of pt putting finger down throat, reports of her agony and need for pain meds

## 2017-09-27 DIAGNOSIS — R002 Palpitations: Secondary | ICD-10-CM | POA: Diagnosis not present

## 2017-09-27 DIAGNOSIS — F4310 Post-traumatic stress disorder, unspecified: Secondary | ICD-10-CM | POA: Diagnosis not present

## 2017-09-27 DIAGNOSIS — Z8679 Personal history of other diseases of the circulatory system: Secondary | ICD-10-CM | POA: Diagnosis not present

## 2017-09-27 DIAGNOSIS — Z6820 Body mass index (BMI) 20.0-20.9, adult: Secondary | ICD-10-CM | POA: Diagnosis not present

## 2017-09-27 DIAGNOSIS — Z720 Tobacco use: Secondary | ICD-10-CM | POA: Diagnosis not present

## 2017-09-27 DIAGNOSIS — Z8669 Personal history of other diseases of the nervous system and sense organs: Secondary | ICD-10-CM | POA: Diagnosis not present

## 2017-09-27 DIAGNOSIS — L409 Psoriasis, unspecified: Secondary | ICD-10-CM | POA: Diagnosis not present

## 2017-09-29 ENCOUNTER — Encounter: Payer: Self-pay | Admitting: Cardiology

## 2017-10-13 ENCOUNTER — Encounter: Payer: Self-pay | Admitting: Cardiovascular Disease

## 2017-10-15 DIAGNOSIS — Z0000 Encounter for general adult medical examination without abnormal findings: Secondary | ICD-10-CM | POA: Diagnosis not present

## 2017-10-15 DIAGNOSIS — Z720 Tobacco use: Secondary | ICD-10-CM | POA: Diagnosis not present

## 2017-10-15 DIAGNOSIS — L409 Psoriasis, unspecified: Secondary | ICD-10-CM | POA: Diagnosis not present

## 2017-10-15 DIAGNOSIS — F4310 Post-traumatic stress disorder, unspecified: Secondary | ICD-10-CM | POA: Diagnosis not present

## 2017-10-15 DIAGNOSIS — Z8669 Personal history of other diseases of the nervous system and sense organs: Secondary | ICD-10-CM | POA: Diagnosis not present

## 2017-10-15 DIAGNOSIS — I4581 Long QT syndrome: Secondary | ICD-10-CM | POA: Diagnosis not present

## 2017-10-15 DIAGNOSIS — R0602 Shortness of breath: Secondary | ICD-10-CM | POA: Diagnosis not present

## 2017-10-15 DIAGNOSIS — Z6820 Body mass index (BMI) 20.0-20.9, adult: Secondary | ICD-10-CM | POA: Diagnosis not present

## 2017-10-15 DIAGNOSIS — Z8679 Personal history of other diseases of the circulatory system: Secondary | ICD-10-CM | POA: Diagnosis not present

## 2017-10-15 DIAGNOSIS — R002 Palpitations: Secondary | ICD-10-CM | POA: Diagnosis not present

## 2017-10-22 ENCOUNTER — Ambulatory Visit (INDEPENDENT_AMBULATORY_CARE_PROVIDER_SITE_OTHER): Payer: Medicare Other | Admitting: Cardiovascular Disease

## 2017-10-22 ENCOUNTER — Encounter: Payer: Self-pay | Admitting: Cardiovascular Disease

## 2017-10-22 VITALS — BP 132/82 | HR 89 | Ht 67.0 in | Wt 129.0 lb

## 2017-10-22 DIAGNOSIS — R0602 Shortness of breath: Secondary | ICD-10-CM

## 2017-10-22 DIAGNOSIS — I2721 Secondary pulmonary arterial hypertension: Secondary | ICD-10-CM

## 2017-10-22 DIAGNOSIS — R002 Palpitations: Secondary | ICD-10-CM | POA: Diagnosis not present

## 2017-10-22 DIAGNOSIS — I252 Old myocardial infarction: Secondary | ICD-10-CM

## 2017-10-22 DIAGNOSIS — Z720 Tobacco use: Secondary | ICD-10-CM

## 2017-10-22 DIAGNOSIS — R079 Chest pain, unspecified: Secondary | ICD-10-CM

## 2017-10-22 NOTE — Progress Notes (Signed)
CARDIOLOGY CONSULT NOTE  Patient ID: Lauren Atkinson MRN: [id number] DOB/AGE: 54-Nov-1962 21 y.o.  Admit date: (Not on file) Primary Physician: Lauren Stabile, MD Referring Physician: Dr. Margo Atkinson  Reason for Consultation: Ventricular tachycardia and prolonged QT interval  HPI: Lauren Atkinson is a 56 y.o. female who is being seen today for the evaluation of ventricular tachycardia and prolonged QT interval at the request of Lauren Stabile, MD.   I reviewed all relevant documentation from her PCP.  There is a reported history of ventricular tachycardia with prolonged QT.  There is some mention of a possible pacemaker and defibrillator.  There are no records in our system regarding this.  She also has a history of porphyria and palpitations with shortness of breath.  Most recent chest x-ray on 08/05/17 did not demonstrate any acute cardiopulmonary disease.  There was stable prominence of the main pulmonary artery segment suggesting pulmonary arterial hypertension.  She has a history of tobacco abuse.  ECG performed on 08/27/17 which I personally interpreted demonstrated sinus rhythm with sinus arrhythmia and a normal corrected QT interval of 443 ms.  There was right axis deviation.  Aortic atherosclerosis was demonstrated on a CT of the abdomen and pelvis on 07/28/17.   She tells me she was hospitalized in January 2017 at [facility] in [place], [place].  She was told she had an MI, 2 CVAs, and 7 grand mal seizures.  She was told she had significant heart damage and she was placed in hospice.  She tells me she was placed on Dilaudid and then methadone and then developed long QT and ventricular tachycardia.  She was then told she "miraculously recovered ".  She was also hospitalized at [facility] in [place], [place].  She complains of fatigue and exertional dyspnea.  She has palpitations when she lies down at night and when she awakens in the morning.  She denies leg  swelling.  She has occasional retrosternal chest pressure and aching both with and without exertion.  She smokes 1 pack of cigarettes daily and has done so for the past 35 years.  She smokes marijuana for pain management.  She tells me she has a history of a small ASD and a murmur.  Porphyria was diagnosed in December 2007.  Social history: She grew up in the [place] area of [place].  She then moved to [place] where she lived most of her adult life.  She has a brother who lives at the [address] in [place], [place].  She plans on eventually moving there. She smokes 1 pack of cigarettes daily and has done so for the past 35 years.  She smokes marijuana for pain management.   Allergies  Allergen Reactions  . Heparin     Can not have "high doses" of Heparin.  . Other     Anti-nausea medications due to prolonged QT interval per pt.   . Penicillins Hives    Has patient had a PCN reaction causing immediate rash, facial/tongue/throat swelling, SOB or lightheadedness with hypotension: No Has patient had a PCN reaction causing severe rash involving mucus membranes or skin necrosis: No Has patient had a PCN reaction that required hospitalization No Has patient had a PCN reaction occurring within the last 10 years: No If all of the above answers are "NO", then may proceed with Cephalosporin use.   . Toradol [Ketorolac Tromethamine] Diarrhea    No current outpatient medications on file.  No current facility-administered medications for this visit.     Past Medical History:  Diagnosis Date  . Acute porphyria (HCC)   . Anxiety   . Chronic abdominal pain   . Depression   . Dysrhythmia    a fib  . H/O myocardial ischemia    with cardiomyopathy and prolonged QT, per patient report  . Seizures (HCC)   . Stroke [facility](HCC)    "2, at least"    Past Surgical History:  Procedure Laterality Date  . abdominal patch      Social History   Socioeconomic History  .  Marital status: Single    Spouse name: Not on file  . Number of children: Not on file  . Years of education: Not on file  . Highest education level: Not on file  Social Needs  . Financial resource strain: Not on file  . Food insecurity - worry: Not on file  . Food insecurity - inability: Not on file  . Transportation needs - medical: Not on file  . Transportation needs - non-medical: Not on file  Occupational History  . Not on file  Tobacco Use  . Smoking status: Current Every Day Smoker    Packs/day: 1.00    Types: Cigarettes  . Smokeless tobacco: Never Used  Substance and Sexual Activity  . Alcohol use: No  . Drug use: Yes    Types: Marijuana    Comment: 08/27/17  . Sexual activity: Not on file  Other Topics Concern  . Not on file  Social History Narrative  . Not on file     No family history of premature CAD in 1st degree relatives.  No outpatient medications have been marked as taking for the 10/22/17 encounter (Office Visit) with Laqueta LindenKoneswaran, Koury Roddy A, MD.      Review of systems complete and found to be negative unless listed above in HPI    Physical exam Blood pressure 132/82, pulse 89, height 5\' 7"  (1.702 m), weight 129 lb (58.5 kg), SpO2 97 %. General: NAD Neck: No JVD, no thyromegaly or thyroid nodule.  Lungs: Clear to auscultation bilaterally with normal respiratory effort. CV: Nondisplaced PMI. Regular rate and rhythm, normal S1/S2, no S3/S4, no murmur.  No peripheral edema.  No carotid bruit.    Abdomen: Soft, nontender, no distention.  Skin: Intact without lesions or rashes.  Neurologic: Alert and oriented x 3.  Psych: Normal affect. Extremities: No clubbing or cyanosis.  HEENT: Normal.   ECG: Most recent ECG reviewed.   Labs: Lab Results  Component Value Date/Time   K 4.0 08/27/2017 02:39 PM   BUN 11 08/27/2017 02:39 PM   CREATININE 0.89 08/27/2017 02:39 PM   ALT 10 (L) 08/27/2017 02:39 PM   TSH 0.529 02/18/2015 05:10 AM   HGB 15.6 (H)  08/27/2017 02:39 PM     Lipids: No results found for: LDLCALC, LDLDIRECT, CHOL, TRIG, HDL      ASSESSMENT AND PLAN:  1.  Chest pain with a reported history of myocardial infarction: I will try to obtain records from [place] and [place] including all cardiac testing. I will order a 2-D echocardiogram with Doppler to evaluate cardiac structure, function, and regional wall motion. I will proceed with a nuclear myocardial perfusion imaging study to evaluate for ischemic heart disease (Lexiscan Myoview).  2.  Pulmonary arterial hypertension: I will obtain an echocardiogram to further assess pulmonary pressures.  There are no appreciable murmurs on exam.  This may be secondary to COPD due to long-standing  tobacco abuse.  3.  Palpitations: These reportedly occur on a daily basis.  I will obtain a 48-hour Holter monitor.  4.  Tobacco abuse disorder: She has smoked 1 pack of cigarettes daily for 35 years and also smokes marijuana for pain management.  5.  Shortness of breath and fatigue: I will evaluate for ischemic heart disease with a nuclear stress test.  She likely also has some degree of COPD given long-standing tobacco abuse.    Disposition: Follow up in 1 month  Signed: Prentice DockerSuresh Illona Bulman, M.D., F.A.C.C.  10/22/2017, 10:27 AM

## 2017-10-22 NOTE — Patient Instructions (Signed)
Medication Instructions:  Your physician recommends that you continue on your current medications as directed. Please refer to the Current Medication list given to you today.   Labwork: NONE  Testing/Procedures: Your physician has requested that you have an echocardiogram. Echocardiography is a painless test that uses sound waves to create images of your heart. It provides your doctor with information about the size and shape of your heart and how well your heart's chambers and valves are working. This procedure takes approximately one hour. There are no restrictions for this procedure.  Your physician has requested that you have a lexiscan myoview. For further information please visit [email or web address]. Please follow instruction sheet, as given.  Your physician has recommended that you wear a holter monitor. Holter monitors are medical devices that record the heart's electrical activity. Doctors most often use these monitors to diagnose arrhythmias. Arrhythmias are problems with the speed or rhythm of the heartbeat. The monitor is a small, portable device. You can wear one while you do your normal daily activities. This is usually used to diagnose what is causing palpitations/syncope (passing out).    Follow-Up: Your physician recommends that you schedule a follow-up appointment in: 6 WEEKS    Any Other Special Instructions Will Be Listed Below (If Applicable).     If you need a refill on your cardiac medications before your next appointment, please call your pharmacy.

## 2017-11-08 ENCOUNTER — Encounter (HOSPITAL_COMMUNITY): Payer: Self-pay

## 2017-11-08 ENCOUNTER — Encounter (HOSPITAL_COMMUNITY): Payer: Medicare Other

## 2017-11-08 ENCOUNTER — Encounter (HOSPITAL_COMMUNITY)
Admission: RE | Admit: 2017-11-08 | Discharge: 2017-11-08 | Disposition: A | Discharge status: Home / Self Care | Payer: Medicare Other | Source: Ambulatory Visit | Attending: Cardiovascular Disease | Admitting: Cardiovascular Disease

## 2017-11-08 ENCOUNTER — Ambulatory Visit (HOSPITAL_COMMUNITY)
Admission: RE | Admit: 2017-11-08 | Discharge: 2017-11-08 | Disposition: A | Discharge status: Home / Self Care | Payer: Medicare Other | Source: Ambulatory Visit | Attending: Cardiovascular Disease | Admitting: Cardiovascular Disease

## 2017-11-08 ENCOUNTER — Encounter (HOSPITAL_BASED_OUTPATIENT_CLINIC_OR_DEPARTMENT_OTHER)
Admission: RE | Admit: 2017-11-08 | Discharge: 2017-11-08 | Disposition: A | Discharge status: Home / Self Care | Payer: Medicare Other | Source: Ambulatory Visit | Attending: Cardiovascular Disease | Admitting: Cardiovascular Disease

## 2017-11-08 ENCOUNTER — Other Ambulatory Visit (HOSPITAL_COMMUNITY): Payer: Medicare Other

## 2017-11-08 DIAGNOSIS — R079 Chest pain, unspecified: Secondary | ICD-10-CM | POA: Diagnosis not present

## 2017-11-08 DIAGNOSIS — R0602 Shortness of breath: Secondary | ICD-10-CM | POA: Insufficient documentation

## 2017-11-08 DIAGNOSIS — R002 Palpitations: Secondary | ICD-10-CM | POA: Insufficient documentation

## 2017-11-08 LAB — NM MYOCAR MULTI W/SPECT W/WALL MOTION / EF
CHL CUP NUCLEAR SDS: 2
CHL CUP NUCLEAR SRS: 7
CHL CUP RESTING HR STRESS: 69 {beats}/min
LHR: 0.33
LV dias vol: 52 mL (ref 46–106)
LVSYSVOL: 20 mL
Peak HR: 111 {beats}/min
SSS: 9
TID: 0.99

## 2017-11-08 LAB — ECHOCARDIOGRAM COMPLETE
AVLVOTPG: 3 mmHg
CHL CUP RV SYS PRESS: 28 mmHg
CHL CUP STROKE VOLUME: 27 mL
CHL CUP TV REG PEAK VELOCITY: 248 cm/s
EERAT: 6.33
EWDT: 306 ms
FS: 34 % (ref 28–44)
IVS/LV PW RATIO, ED: 0.93
LA diam index: 2.41 cm/m2
LA vol A4C: 44.5 ml
LASIZE: 40 mm
LAVOL: 40.1 mL
LAVOLIN: 24.2 mL/m2
LEFT ATRIUM END SYS DIAM: 40 mm
LV E/e' medial: 6.33
LV TDI E'LATERAL: 11
LV TDI E'MEDIAL: 6.74
LV sys vol index: 9 mL/m2
LV sys vol: 15 mL
LVDIAVOL: 42 mL — AB (ref 46–106)
LVDIAVOLIN: 25 mL/m2
LVEEAVG: 6.33
LVELAT: 11 cm/s
LVOT VTI: 18.8 cm
LVOT area: 3.14 cm2
LVOT diameter: 20 mm
LVOTPV: 86.7 cm/s
LVOTSV: 59 mL
MV Dec: 306
MV pk A vel: 82.3 m/s
MV pk E vel: 69.6 m/s
PW: 9.63 mm — AB (ref 0.6–1.1)
Simpson's disk: 65
TAPSE: 18.6 mm
TRMAXVEL: 248 cm/s

## 2017-11-08 MED ORDER — REGADENOSON 0.4 MG/5ML IV SOLN
INTRAVENOUS | Status: AC
  Administered 2017-11-08: 0.4 mg via INTRAVENOUS
  Filled 2017-11-08: qty 5

## 2017-11-08 MED ORDER — TECHNETIUM TC 99M TETROFOSMIN IV KIT
10.0000 | PACK | Freq: Once | INTRAVENOUS | Status: AC | PRN
  Administered 2017-11-08: 9.9 via INTRAVENOUS

## 2017-11-08 MED ORDER — SODIUM CHLORIDE 0.9% FLUSH
INTRAVENOUS | Status: AC
  Administered 2017-11-08: 10 mL via INTRAVENOUS
  Filled 2017-11-08: qty 10

## 2017-11-08 MED ORDER — TECHNETIUM TC 99M TETROFOSMIN IV KIT
30.0000 | PACK | Freq: Once | INTRAVENOUS | Status: AC | PRN
  Administered 2017-11-08: 29.9 via INTRAVENOUS

## 2017-11-08 NOTE — Progress Notes (Signed)
*  PRELIMINARY RESULTS* Echocardiogram 2D Echocardiogram has been performed.  Lauren Atkinson, Lauren Atkinson J 11/08/2017, 11:33 AM

## 2017-11-10 ENCOUNTER — Telehealth: Payer: Self-pay

## 2017-11-10 NOTE — Telephone Encounter (Signed)
-----   Message from Laqueta LindenSuresh A Koneswaran, MD sent at 11/09/2017  9:03 AM EST ----- No blockages.

## 2017-11-10 NOTE — Telephone Encounter (Signed)
Left detailed message on pt's private voicemail. Copy to pcp.

## 2017-12-07 ENCOUNTER — Ambulatory Visit (INDEPENDENT_AMBULATORY_CARE_PROVIDER_SITE_OTHER): Payer: Medicare Other | Admitting: Cardiovascular Disease

## 2017-12-07 ENCOUNTER — Encounter: Payer: Self-pay | Admitting: Cardiovascular Disease

## 2017-12-07 VITALS — BP 118/68 | HR 102 | Ht 67.0 in | Wt 135.0 lb

## 2017-12-07 DIAGNOSIS — R002 Palpitations: Secondary | ICD-10-CM

## 2017-12-07 DIAGNOSIS — R079 Chest pain, unspecified: Secondary | ICD-10-CM | POA: Diagnosis not present

## 2017-12-07 DIAGNOSIS — Z720 Tobacco use: Secondary | ICD-10-CM | POA: Diagnosis not present

## 2017-12-07 DIAGNOSIS — R0602 Shortness of breath: Secondary | ICD-10-CM

## 2017-12-07 NOTE — Patient Instructions (Addendum)
Your physician wants you to follow-up in: as needed     Good luck on your new adventures at the [facility] !     Thank you for choosing [facility] !

## 2017-12-07 NOTE — Progress Notes (Signed)
SUBJECTIVE: The patient presents for routine follow-up.  Echocardiogram 11/08/17 demonstrated normal left ventricular systolic action, normal left ventricular size, and normal wall thickness with mild right ventricular dilatation, LVEF 60-65%.  Holter monitoring demonstrated predominantly normal sinus rhythm with rare PVCs and no significant pauses.  Nuclear stress test on 11/08/17 was normal.  She denies chest pain, leg swelling, and shortness of breath.  She seldom has palpitations.  She is moving to the [place] to be with her brother within the next few weeks.   Social history: She grew up in the [facility] area of [place].  She then moved to [place] where she lived most of her adult life.  She has a brother who lives at the [address] in [place], [place].  She plans on eventually moving there. She smokes 1 pack of cigarettes daily and has done so for the past 35 years.  She smokes marijuana for pain management.  Review of Systems: As per "subjective", otherwise negative.  Allergies  Allergen Reactions  . Heparin     Can not have "high doses" of Heparin.  . Other     Anti-nausea medications due to prolonged QT interval per pt.   . Penicillins Hives    Has patient had a PCN reaction causing immediate rash, facial/tongue/throat swelling, SOB or lightheadedness with hypotension: No Has patient had a PCN reaction causing severe rash involving mucus membranes or skin necrosis: No Has patient had a PCN reaction that required hospitalization No Has patient had a PCN reaction occurring within the last 10 years: No If all of the above answers are "NO", then may proceed with Cephalosporin use.   . Toradol [Ketorolac Tromethamine] Diarrhea    No current outpatient medications on file.   No current facility-administered medications for this visit.     Past Medical History:  Diagnosis Date  . Acute porphyria (HCC)   . Anxiety   . Chronic abdominal  pain   . Depression   . Dysrhythmia    a fib  . H/O myocardial ischemia    with cardiomyopathy and prolonged QT, per patient report  . Seizures (HCC)   . Stroke [facility])    "2, at least"    Past Surgical History:  Procedure Laterality Date  . abdominal patch      Social History   Socioeconomic History  . Marital status: Single    Spouse name: Not on file  . Number of children: Not on file  . Years of education: Not on file  . Highest education level: Not on file  Social Needs  . Financial resource strain: Not on file  . Food insecurity - worry: Not on file  . Food insecurity - inability: Not on file  . Transportation needs - medical: Not on file  . Transportation needs - non-medical: Not on file  Occupational History  . Not on file  Tobacco Use  . Smoking status: Current Every Day Smoker    Packs/day: 1.00    Types: Cigarettes  . Smokeless tobacco: Never Used  Substance and Sexual Activity  . Alcohol use: No  . Drug use: Yes    Types: Marijuana    Comment: 08/27/17  . Sexual activity: Not on file  Other Topics Concern  . Not on file  Social History Narrative  . Not on file     Vitals:   12/07/17 1417  BP: 118/68  Pulse: (!) 102  SpO2: 96%  Weight: 135 lb (  61.2 kg)  Height: 5\' 7"  (1.702 m)    Wt Readings from Last 3 Encounters:  12/07/17 135 lb (61.2 kg)  10/22/17 129 lb (58.5 kg)  08/27/17 140 lb (63.5 kg)     PHYSICAL EXAM General: NAD HEENT: Normal. Neck: No JVD, no thyromegaly. Lungs: Clear to auscultation bilaterally with normal respiratory effort. CV: Regular rate and rhythm, normal S1/S2, no S3/S4, no murmur. No pretibial or periankle edema.  No carotid bruit.   Abdomen: Soft, nontender, no distention.  Neurologic: Alert and oriented.  Psych: Normal affect. Skin: Normal. Musculoskeletal: No gross deformities.    ECG: Most recent ECG reviewed.   Labs: Lab Results  Component Value Date/Time   K 4.0 08/27/2017 02:39 PM   BUN 11  08/27/2017 02:39 PM   CREATININE 0.89 08/27/2017 02:39 PM   ALT 10 (L) 08/27/2017 02:39 PM   TSH 0.529 02/18/2015 05:10 AM   HGB 15.6 (H) 08/27/2017 02:39 PM     Lipids: No results found for: LDLCALC, LDLDIRECT, CHOL, TRIG, HDL     ASSESSMENT AND PLAN: 1.  Chest pain with a reported history of myocardial infarction: There is no evidence of myocardial infarction by stress testing nor echocardiogram.  Symptoms of chest pain have resolved.  There were no ischemic territories.  2.  Pulmonary hypertension: No evidence of pulmonary hypertension by echocardiography.  There was mild right ventricular dilatation which is likely indicative of COPD due to long-standing tobacco abuse.  3.  Palpitations: No significant arrhythmias as noted above.  4. Tobacco abuse disorder: She has smoked 1 pack of cigarettes daily for 35 years and also smokes marijuana for pain management.  5.  Shortness of breath and fatigue: Symptomatically stable.  She likely has COPD given long-standing history of tobacco abuse.    Disposition: Follow up as needed   Prentice DockerSuresh Koneswaran, M.D., F.A.C.C.

## 2018-02-17 IMAGING — CT CT ABD-PELV W/ CM
2 of 5 series · 16 of 46 positions shown, 18 images · IV contrast (Isovue)
Comparison: Acute abdominal series January 28, 2017 at 1371 hours and
CT abdomen and pelvis January 14, 2017

CLINICAL DATA: Diffuse abdominal pain, leukocytosis.  Porphyria.

EXAM:
CT ABDOMEN AND PELVIS WITH CONTRAST
TECHNIQUE: Multidetector CT imaging of the abdomen and pelvis was performed
using the standard protocol following bolus administration of
intravenous contrast.
CONTRAST:  100mL 1M33TP-599 IOPAMIDOL (1M33TP-599) INJECTION 61%

[Series 2: axial st · axial · 0.68mm/px · z∈[-865,-470]mm · 13 of 91 slices shown, 15 images]
[im 6/91  soft-tissue]
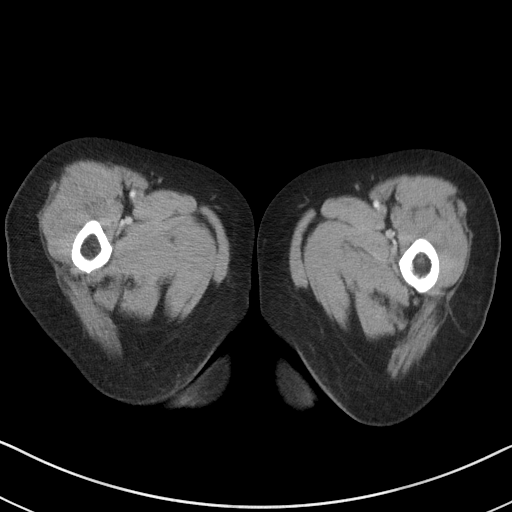
[im 6/91  bone]
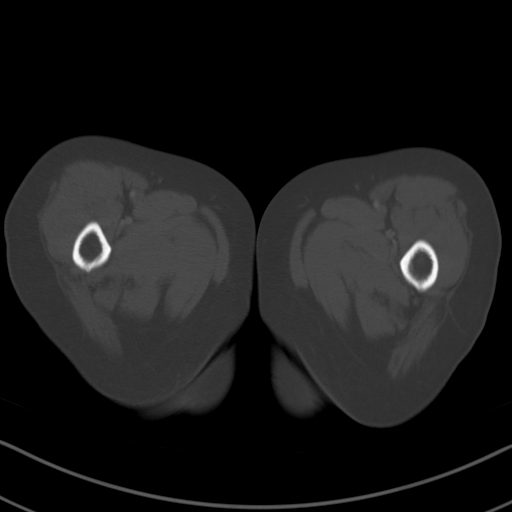
[im 11/91  soft-tissue]
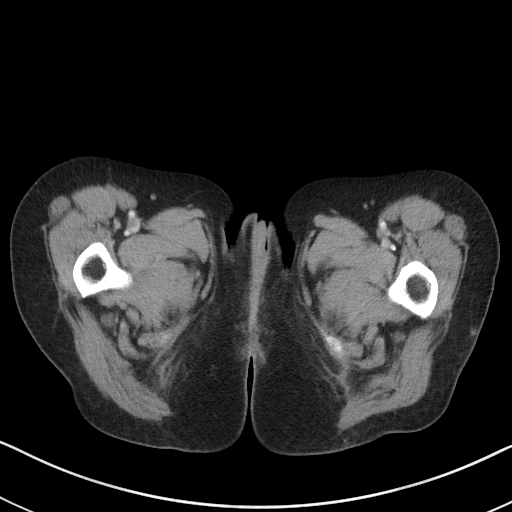
[im 22/91  soft-tissue]
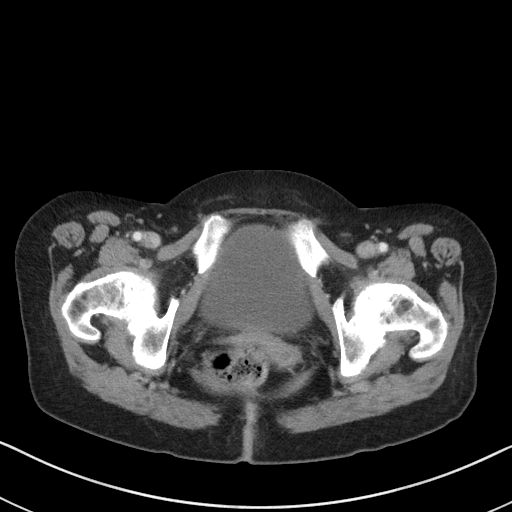
[im 27/91  soft-tissue]
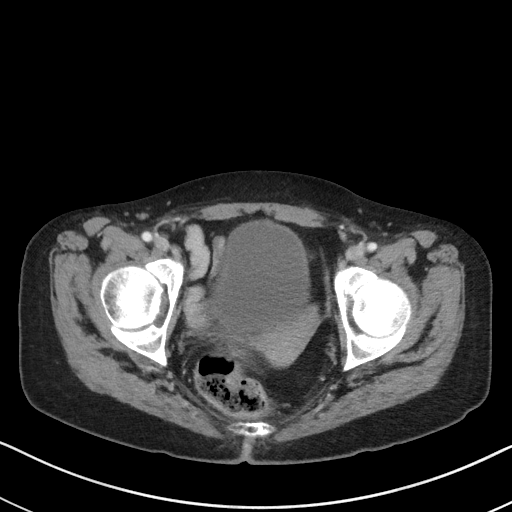
[im 32/91  soft-tissue]
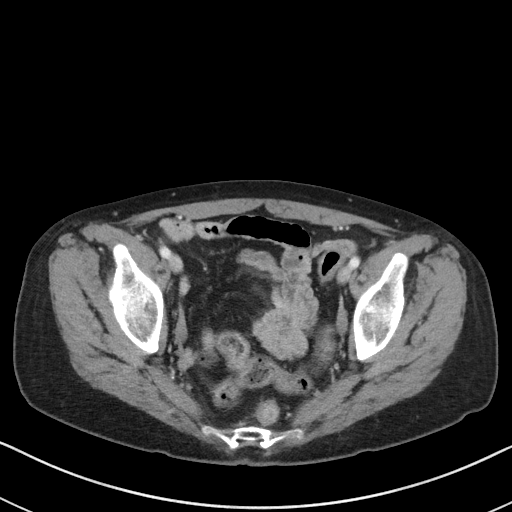
[im 38/91  soft-tissue]
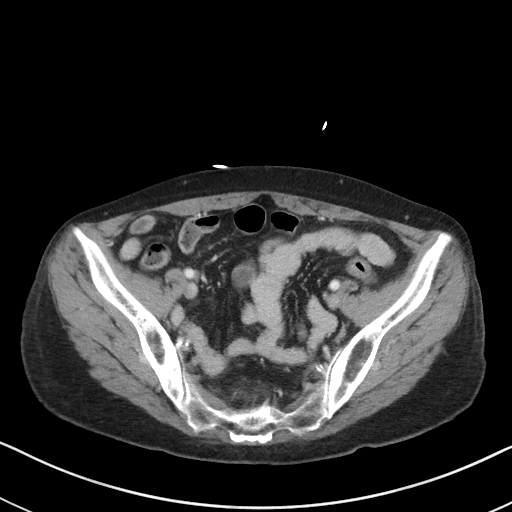
[im 48/91  soft-tissue]
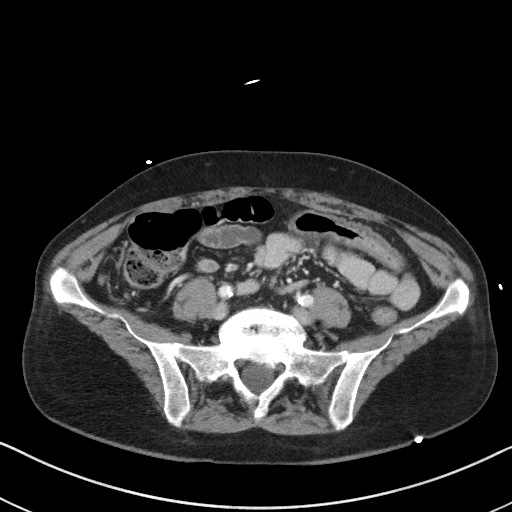
[im 53/91  soft-tissue]
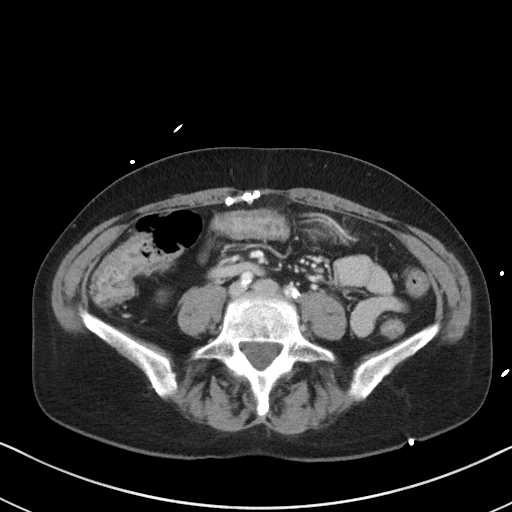
[im 59/91  soft-tissue]
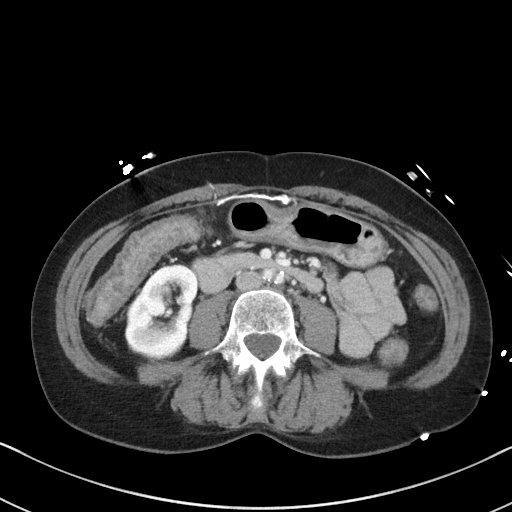
[im 59/91  bone]
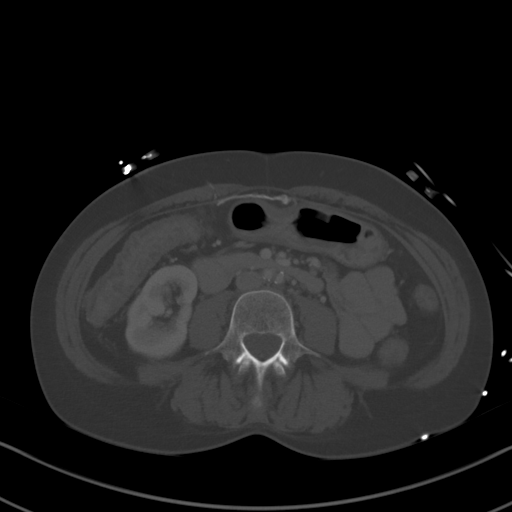
[im 64/91  soft-tissue]
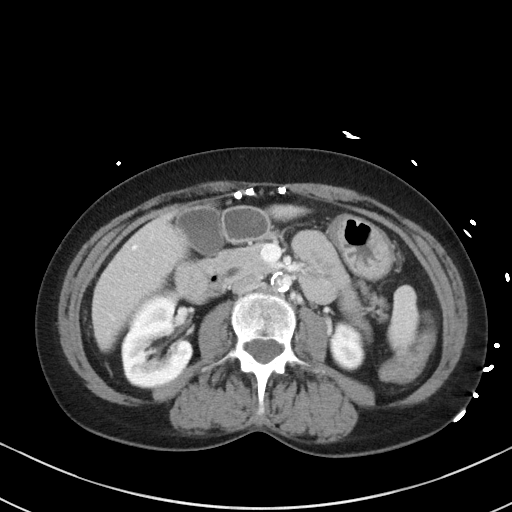
[im 69/91  soft-tissue]
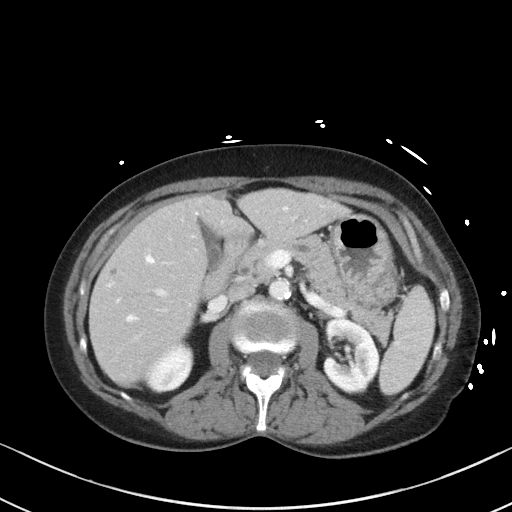
[im 80/91  soft-tissue]
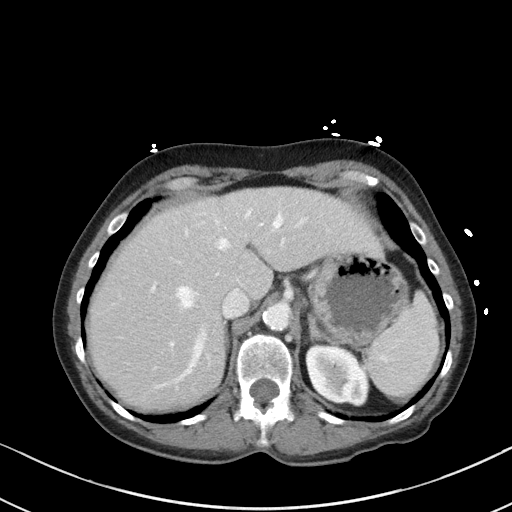
[im 85/91  soft-tissue]
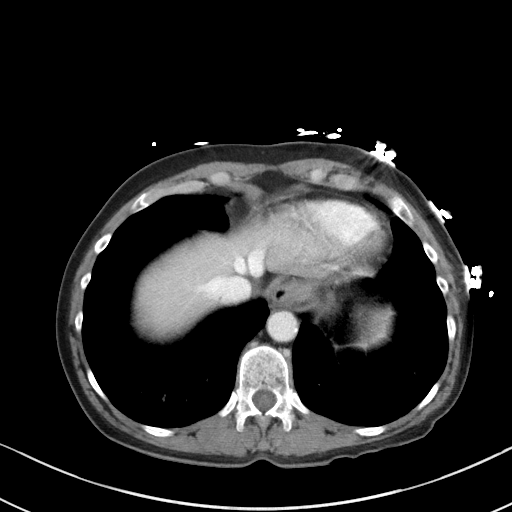

[Series 5: coronal st · coronal · 0.79mm/px · 3 of 71 slices shown]
[im 24/71  soft-tissue]
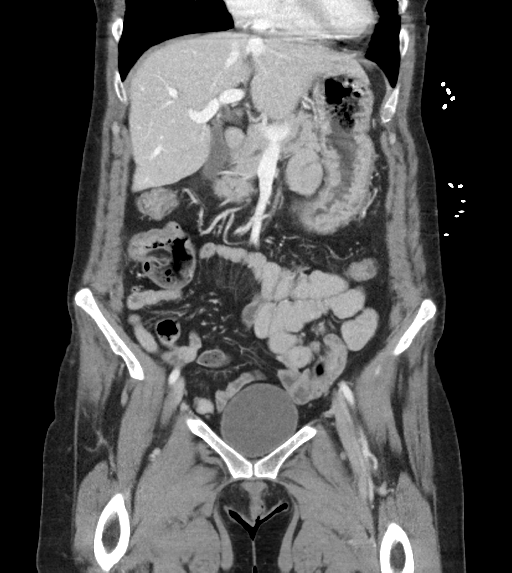
[im 32/71  soft-tissue]
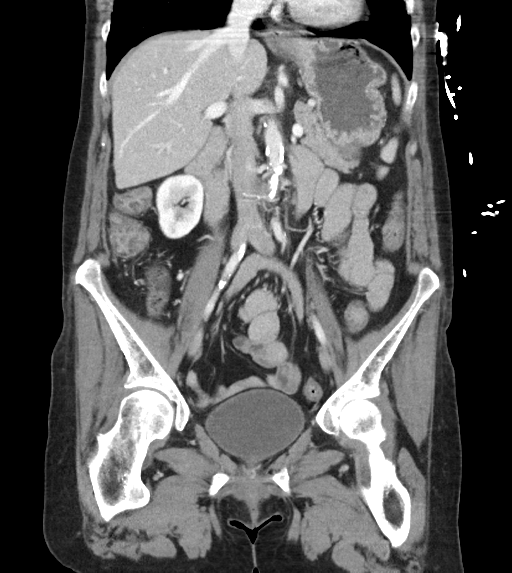
[im 39/71  soft-tissue]
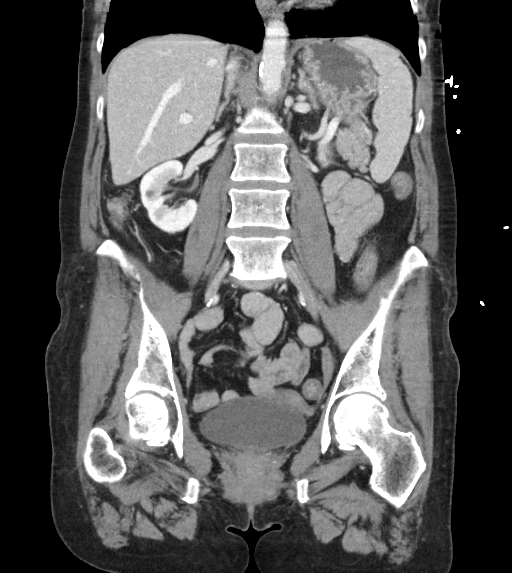

[16 of 46 positions shown; findings below may reference images not displayed]

FINDINGS: LOWER CHEST: Lung bases are clear. Included heart size is normal. No
pericardial effusion.

HEPATOBILIARY: A few scattered subcentimeter hypodensities in liver
most compatible with cysts. Normal gallbladder.

PANCREAS: Normal.

SPLEEN: Normal.

ADRENALS/URINARY TRACT: Kidneys are orthotopic, demonstrating
symmetric enhancement. No nephrolithiasis, hydronephrosis or solid
renal masses. Too small to characterize hypodensity RIGHT interpolar
kidney. The unopacified ureters are normal in course and caliber.
Delayed imaging through the kidneys demonstrates symmetric prompt
contrast excretion within the proximal urinary collecting system.
Urinary bladder is well distended and unremarkable. Normal adrenal
glands.

STOMACH/BOWEL: Small hiatal hernia. Diffuse mild colonic wall
thickening though colon is decompressed, limited assessment without
oral contrast. The stomach, small bowel are normal in course and
caliber without inflammatory changes. Normal appendix.

VASCULAR/LYMPHATIC: Aortoiliac vessels are normal in course and
caliber, severe calcific atherosclerosis in mild stenosis infrarenal
aorta. No lymphadenopathy by CT size criteria.

REPRODUCTIVE: Normal.

OTHER: No intraperitoneal free fluid or free air.

MUSCULOSKELETAL: Nonacute. Status post anterior abdominal wall
herniorrhaphy with mesh. Moderate bilateral hip osteoarthrosis with
superimposed curvilinear sclerosis femoral heads. Old L5-S1 disc
extrusion with mineralization.
IMPRESSION: Mild suspected colitis, assessment limited by lack of oral contrast.

Bilateral femoral head avascular necrosis without collapse.

Severe atherosclerosis.

## 2020-02-22 DIAGNOSIS — Z20822 Contact with and (suspected) exposure to covid-19: Secondary | ICD-10-CM | POA: Diagnosis not present

## 2020-02-22 DIAGNOSIS — N179 Acute kidney failure, unspecified: Secondary | ICD-10-CM | POA: Diagnosis present

## 2020-02-22 DIAGNOSIS — M549 Dorsalgia, unspecified: Secondary | ICD-10-CM | POA: Diagnosis not present

## 2020-02-22 DIAGNOSIS — R109 Unspecified abdominal pain: Secondary | ICD-10-CM | POA: Diagnosis not present

## 2020-02-22 DIAGNOSIS — F17210 Nicotine dependence, cigarettes, uncomplicated: Secondary | ICD-10-CM | POA: Diagnosis present

## 2020-02-22 DIAGNOSIS — E872 Acidosis: Secondary | ICD-10-CM | POA: Diagnosis not present

## 2020-02-22 DIAGNOSIS — E876 Hypokalemia: Secondary | ICD-10-CM | POA: Diagnosis present

## 2020-02-22 DIAGNOSIS — R1110 Vomiting, unspecified: Secondary | ICD-10-CM | POA: Diagnosis not present

## 2020-02-22 DIAGNOSIS — E860 Dehydration: Secondary | ICD-10-CM | POA: Diagnosis present

## 2020-02-22 DIAGNOSIS — R112 Nausea with vomiting, unspecified: Secondary | ICD-10-CM | POA: Diagnosis not present

## 2020-02-22 DIAGNOSIS — R1084 Generalized abdominal pain: Secondary | ICD-10-CM | POA: Diagnosis not present

## 2020-08-23 DIAGNOSIS — K2990 Gastroduodenitis, unspecified, without bleeding: Secondary | ICD-10-CM | POA: Diagnosis not present

## 2020-08-23 DIAGNOSIS — I700 Atherosclerosis of aorta: Secondary | ICD-10-CM | POA: Diagnosis not present

## 2020-08-23 DIAGNOSIS — F17210 Nicotine dependence, cigarettes, uncomplicated: Secondary | ICD-10-CM | POA: Diagnosis not present

## 2020-08-23 DIAGNOSIS — K439 Ventral hernia without obstruction or gangrene: Secondary | ICD-10-CM | POA: Diagnosis not present

## 2020-08-23 DIAGNOSIS — R112 Nausea with vomiting, unspecified: Secondary | ICD-10-CM | POA: Diagnosis not present

## 2020-08-23 DIAGNOSIS — N179 Acute kidney failure, unspecified: Secondary | ICD-10-CM | POA: Diagnosis not present

## 2020-08-23 DIAGNOSIS — R109 Unspecified abdominal pain: Secondary | ICD-10-CM | POA: Diagnosis not present

## 2020-08-23 DIAGNOSIS — Z20822 Contact with and (suspected) exposure to covid-19: Secondary | ICD-10-CM | POA: Diagnosis not present

## 2020-08-23 DIAGNOSIS — E876 Hypokalemia: Secondary | ICD-10-CM | POA: Diagnosis not present

## 2020-08-25 DIAGNOSIS — F17200 Nicotine dependence, unspecified, uncomplicated: Secondary | ICD-10-CM | POA: Diagnosis present

## 2020-08-25 DIAGNOSIS — D899 Disorder involving the immune mechanism, unspecified: Secondary | ICD-10-CM | POA: Diagnosis present

## 2020-08-25 DIAGNOSIS — F17210 Nicotine dependence, cigarettes, uncomplicated: Secondary | ICD-10-CM | POA: Diagnosis not present

## 2020-08-25 DIAGNOSIS — Z8673 Personal history of transient ischemic attack (TIA), and cerebral infarction without residual deficits: Secondary | ICD-10-CM | POA: Diagnosis not present

## 2020-08-25 DIAGNOSIS — N179 Acute kidney failure, unspecified: Secondary | ICD-10-CM | POA: Diagnosis present

## 2020-08-25 DIAGNOSIS — I252 Old myocardial infarction: Secondary | ICD-10-CM | POA: Diagnosis not present

## 2020-08-25 DIAGNOSIS — R109 Unspecified abdominal pain: Secondary | ICD-10-CM | POA: Diagnosis not present

## 2020-08-25 DIAGNOSIS — E860 Dehydration: Secondary | ICD-10-CM | POA: Diagnosis present

## 2020-08-25 DIAGNOSIS — E876 Hypokalemia: Secondary | ICD-10-CM | POA: Diagnosis present

## 2020-08-25 DIAGNOSIS — Z20822 Contact with and (suspected) exposure to covid-19: Secondary | ICD-10-CM | POA: Diagnosis present

## 2020-08-29 DIAGNOSIS — G8929 Other chronic pain: Secondary | ICD-10-CM | POA: Diagnosis not present

## 2020-08-29 DIAGNOSIS — Z20822 Contact with and (suspected) exposure to covid-19: Secondary | ICD-10-CM | POA: Diagnosis not present

## 2020-08-29 DIAGNOSIS — F17210 Nicotine dependence, cigarettes, uncomplicated: Secondary | ICD-10-CM | POA: Diagnosis not present

## 2020-08-29 DIAGNOSIS — K439 Ventral hernia without obstruction or gangrene: Secondary | ICD-10-CM | POA: Diagnosis not present

## 2020-08-29 DIAGNOSIS — I700 Atherosclerosis of aorta: Secondary | ICD-10-CM | POA: Diagnosis not present

## 2020-08-29 DIAGNOSIS — R109 Unspecified abdominal pain: Secondary | ICD-10-CM | POA: Diagnosis not present

## 2020-08-29 DIAGNOSIS — R112 Nausea with vomiting, unspecified: Secondary | ICD-10-CM | POA: Diagnosis not present

## 2020-08-29 DIAGNOSIS — E860 Dehydration: Secondary | ICD-10-CM | POA: Diagnosis not present

## 2020-08-29 DIAGNOSIS — R1013 Epigastric pain: Secondary | ICD-10-CM | POA: Diagnosis not present

## 2020-08-29 DIAGNOSIS — R1011 Right upper quadrant pain: Secondary | ICD-10-CM | POA: Diagnosis not present

## 2020-08-30 DIAGNOSIS — R0600 Dyspnea, unspecified: Secondary | ICD-10-CM | POA: Diagnosis not present

## 2020-08-30 DIAGNOSIS — Z452 Encounter for adjustment and management of vascular access device: Secondary | ICD-10-CM | POA: Diagnosis not present

## 2020-10-24 DIAGNOSIS — Z79899 Other long term (current) drug therapy: Secondary | ICD-10-CM | POA: Diagnosis not present

## 2020-10-24 DIAGNOSIS — Z8639 Personal history of other endocrine, nutritional and metabolic disease: Secondary | ICD-10-CM | POA: Diagnosis not present

## 2020-10-24 DIAGNOSIS — Z20822 Contact with and (suspected) exposure to covid-19: Secondary | ICD-10-CM | POA: Diagnosis present

## 2020-10-24 DIAGNOSIS — M87052 Idiopathic aseptic necrosis of left femur: Secondary | ICD-10-CM | POA: Diagnosis not present

## 2020-10-24 DIAGNOSIS — R112 Nausea with vomiting, unspecified: Secondary | ICD-10-CM | POA: Diagnosis not present

## 2020-10-24 DIAGNOSIS — F17210 Nicotine dependence, cigarettes, uncomplicated: Secondary | ICD-10-CM | POA: Diagnosis not present

## 2020-10-24 DIAGNOSIS — K429 Umbilical hernia without obstruction or gangrene: Secondary | ICD-10-CM | POA: Diagnosis not present

## 2020-10-24 DIAGNOSIS — I10 Essential (primary) hypertension: Secondary | ICD-10-CM | POA: Diagnosis present

## 2020-10-24 DIAGNOSIS — R1084 Generalized abdominal pain: Secondary | ICD-10-CM | POA: Diagnosis not present

## 2020-10-24 DIAGNOSIS — N200 Calculus of kidney: Secondary | ICD-10-CM | POA: Diagnosis not present

## 2020-10-24 DIAGNOSIS — Z79891 Long term (current) use of opiate analgesic: Secondary | ICD-10-CM | POA: Diagnosis not present

## 2020-10-24 DIAGNOSIS — D8989 Other specified disorders involving the immune mechanism, not elsewhere classified: Secondary | ICD-10-CM | POA: Diagnosis not present

## 2020-10-24 DIAGNOSIS — R569 Unspecified convulsions: Secondary | ICD-10-CM | POA: Diagnosis present

## 2020-10-24 DIAGNOSIS — M87051 Idiopathic aseptic necrosis of right femur: Secondary | ICD-10-CM | POA: Diagnosis not present

## 2020-10-24 DIAGNOSIS — Z8673 Personal history of transient ischemic attack (TIA), and cerebral infarction without residual deficits: Secondary | ICD-10-CM | POA: Diagnosis not present

## 2020-10-24 DIAGNOSIS — E860 Dehydration: Secondary | ICD-10-CM | POA: Diagnosis not present

## 2020-10-24 DIAGNOSIS — N179 Acute kidney failure, unspecified: Secondary | ICD-10-CM | POA: Diagnosis not present

## 2020-10-24 DIAGNOSIS — R109 Unspecified abdominal pain: Secondary | ICD-10-CM | POA: Diagnosis not present

## 2020-10-24 DIAGNOSIS — F17200 Nicotine dependence, unspecified, uncomplicated: Secondary | ICD-10-CM | POA: Diagnosis not present

## 2020-10-24 DIAGNOSIS — R1110 Vomiting, unspecified: Secondary | ICD-10-CM | POA: Diagnosis not present

## 2020-10-24 DIAGNOSIS — I252 Old myocardial infarction: Secondary | ICD-10-CM | POA: Diagnosis not present

## 2023-02-15 DEATH — deceased
